# Patient Record
Sex: Male | Born: 1970
Health system: Southern US, Community
[De-identification: ages and names within clinical notes are randomized; demographics above are authoritative.]

## PROBLEM LIST (undated history)

## (undated) DIAGNOSIS — F79 Unspecified intellectual disabilities: Secondary | ICD-10-CM

## (undated) DIAGNOSIS — R569 Unspecified convulsions: Secondary | ICD-10-CM

## (undated) DIAGNOSIS — N400 Enlarged prostate without lower urinary tract symptoms: Secondary | ICD-10-CM

## (undated) DIAGNOSIS — J449 Chronic obstructive pulmonary disease, unspecified: Secondary | ICD-10-CM

## (undated) DIAGNOSIS — F1020 Alcohol dependence, uncomplicated: Secondary | ICD-10-CM

## (undated) HISTORY — PX: TRANSURETHRAL RESECTION OF PROSTATE: SHX73

---

## 1998-03-06 ENCOUNTER — Emergency Department (HOSPITAL_COMMUNITY): Admission: EM | Admit: 1998-03-06 | Discharge: 1998-03-06 | Payer: Self-pay | Admitting: Emergency Medicine

## 2001-02-14 ENCOUNTER — Emergency Department (HOSPITAL_COMMUNITY): Admission: EM | Admit: 2001-02-14 | Discharge: 2001-02-15 | Payer: Self-pay | Admitting: Internal Medicine

## 2001-04-05 ENCOUNTER — Ambulatory Visit (HOSPITAL_COMMUNITY): Admission: RE | Admit: 2001-04-05 | Discharge: 2001-04-05 | Payer: Self-pay | Admitting: Unknown Physician Specialty

## 2001-04-09 ENCOUNTER — Encounter: Payer: Self-pay | Admitting: *Deleted

## 2001-04-09 ENCOUNTER — Emergency Department (HOSPITAL_COMMUNITY): Admission: EM | Admit: 2001-04-09 | Discharge: 2001-04-09 | Payer: Self-pay | Admitting: *Deleted

## 2001-04-13 ENCOUNTER — Emergency Department (HOSPITAL_COMMUNITY): Admission: EM | Admit: 2001-04-13 | Discharge: 2001-04-13 | Payer: Self-pay | Admitting: Internal Medicine

## 2001-04-21 ENCOUNTER — Encounter: Payer: Self-pay | Admitting: Emergency Medicine

## 2001-04-21 ENCOUNTER — Emergency Department (HOSPITAL_COMMUNITY): Admission: EM | Admit: 2001-04-21 | Discharge: 2001-04-21 | Payer: Self-pay | Admitting: Emergency Medicine

## 2001-08-09 ENCOUNTER — Emergency Department (HOSPITAL_COMMUNITY): Admission: EM | Admit: 2001-08-09 | Discharge: 2001-08-09 | Payer: Self-pay | Admitting: *Deleted

## 2001-09-26 ENCOUNTER — Emergency Department (HOSPITAL_COMMUNITY): Admission: EM | Admit: 2001-09-26 | Discharge: 2001-09-26 | Payer: Self-pay | Admitting: *Deleted

## 2001-10-03 ENCOUNTER — Emergency Department (HOSPITAL_COMMUNITY): Admission: EM | Admit: 2001-10-03 | Discharge: 2001-10-04 | Payer: Self-pay | Admitting: *Deleted

## 2001-10-31 ENCOUNTER — Encounter: Payer: Self-pay | Admitting: Emergency Medicine

## 2001-10-31 ENCOUNTER — Emergency Department (HOSPITAL_COMMUNITY): Admission: EM | Admit: 2001-10-31 | Discharge: 2001-10-31 | Payer: Self-pay | Admitting: Emergency Medicine

## 2001-11-11 ENCOUNTER — Emergency Department (HOSPITAL_COMMUNITY): Admission: EM | Admit: 2001-11-11 | Discharge: 2001-11-11 | Payer: Self-pay | Admitting: *Deleted

## 2001-11-30 ENCOUNTER — Emergency Department (HOSPITAL_COMMUNITY): Admission: EM | Admit: 2001-11-30 | Discharge: 2001-12-01 | Payer: Self-pay

## 2002-04-21 ENCOUNTER — Emergency Department (HOSPITAL_COMMUNITY): Admission: EM | Admit: 2002-04-21 | Discharge: 2002-04-21 | Payer: Self-pay | Admitting: Emergency Medicine

## 2002-05-23 ENCOUNTER — Encounter: Payer: Self-pay | Admitting: Emergency Medicine

## 2002-05-23 ENCOUNTER — Emergency Department (HOSPITAL_COMMUNITY): Admission: EM | Admit: 2002-05-23 | Discharge: 2002-05-23 | Payer: Self-pay | Admitting: Emergency Medicine

## 2003-01-12 ENCOUNTER — Emergency Department (HOSPITAL_COMMUNITY): Admission: EM | Admit: 2003-01-12 | Discharge: 2003-01-12 | Payer: Self-pay | Admitting: Emergency Medicine

## 2003-02-10 ENCOUNTER — Emergency Department (HOSPITAL_COMMUNITY): Admission: EM | Admit: 2003-02-10 | Discharge: 2003-02-11 | Payer: Self-pay | Admitting: Emergency Medicine

## 2003-02-11 ENCOUNTER — Encounter: Payer: Self-pay | Admitting: Emergency Medicine

## 2003-02-23 ENCOUNTER — Emergency Department (HOSPITAL_COMMUNITY): Admission: EM | Admit: 2003-02-23 | Discharge: 2003-02-23 | Payer: Self-pay | Admitting: *Deleted

## 2003-02-23 ENCOUNTER — Encounter: Payer: Self-pay | Admitting: *Deleted

## 2003-03-13 ENCOUNTER — Emergency Department (HOSPITAL_COMMUNITY): Admission: EM | Admit: 2003-03-13 | Discharge: 2003-03-13 | Payer: Self-pay | Admitting: Emergency Medicine

## 2003-03-18 ENCOUNTER — Emergency Department (HOSPITAL_COMMUNITY): Admission: EM | Admit: 2003-03-18 | Discharge: 2003-03-18 | Payer: Self-pay | Admitting: *Deleted

## 2003-03-18 ENCOUNTER — Encounter: Payer: Self-pay | Admitting: *Deleted

## 2003-03-26 ENCOUNTER — Emergency Department (HOSPITAL_COMMUNITY): Admission: EM | Admit: 2003-03-26 | Discharge: 2003-03-26 | Payer: Self-pay | Admitting: Emergency Medicine

## 2003-05-07 ENCOUNTER — Emergency Department (HOSPITAL_COMMUNITY): Admission: EM | Admit: 2003-05-07 | Discharge: 2003-05-08 | Payer: Self-pay | Admitting: Emergency Medicine

## 2003-06-05 ENCOUNTER — Emergency Department (HOSPITAL_COMMUNITY): Admission: EM | Admit: 2003-06-05 | Discharge: 2003-06-06 | Payer: Self-pay | Admitting: *Deleted

## 2003-08-26 ENCOUNTER — Emergency Department (HOSPITAL_COMMUNITY): Admission: EM | Admit: 2003-08-26 | Discharge: 2003-08-26 | Payer: Self-pay | Admitting: Emergency Medicine

## 2003-08-29 ENCOUNTER — Emergency Department (HOSPITAL_COMMUNITY): Admission: EM | Admit: 2003-08-29 | Discharge: 2003-08-30 | Payer: Self-pay | Admitting: Emergency Medicine

## 2003-09-25 ENCOUNTER — Emergency Department (HOSPITAL_COMMUNITY): Admission: EM | Admit: 2003-09-25 | Discharge: 2003-09-25 | Payer: Self-pay | Admitting: Internal Medicine

## 2003-11-18 ENCOUNTER — Ambulatory Visit (HOSPITAL_COMMUNITY): Admission: RE | Admit: 2003-11-18 | Discharge: 2003-11-18 | Payer: Self-pay | Admitting: Family Medicine

## 2004-03-24 ENCOUNTER — Emergency Department (HOSPITAL_COMMUNITY): Admission: EM | Admit: 2004-03-24 | Discharge: 2004-03-24 | Payer: Self-pay | Admitting: *Deleted

## 2004-04-03 ENCOUNTER — Emergency Department (HOSPITAL_COMMUNITY): Admission: EM | Admit: 2004-04-03 | Discharge: 2004-04-03 | Payer: Self-pay | Admitting: Emergency Medicine

## 2004-04-04 ENCOUNTER — Emergency Department (HOSPITAL_COMMUNITY): Admission: EM | Admit: 2004-04-04 | Discharge: 2004-04-04 | Payer: Self-pay | Admitting: Emergency Medicine

## 2004-05-07 ENCOUNTER — Emergency Department (HOSPITAL_COMMUNITY): Admission: EM | Admit: 2004-05-07 | Discharge: 2004-05-08 | Payer: Self-pay | Admitting: Emergency Medicine

## 2004-09-11 ENCOUNTER — Emergency Department (HOSPITAL_COMMUNITY): Admission: EM | Admit: 2004-09-11 | Discharge: 2004-09-11 | Payer: Self-pay | Admitting: Emergency Medicine

## 2004-10-09 ENCOUNTER — Emergency Department (HOSPITAL_COMMUNITY): Admission: EM | Admit: 2004-10-09 | Discharge: 2004-10-10 | Payer: Self-pay | Admitting: *Deleted

## 2004-10-20 ENCOUNTER — Emergency Department (HOSPITAL_COMMUNITY): Admission: EM | Admit: 2004-10-20 | Discharge: 2004-10-20 | Payer: Self-pay | Admitting: *Deleted

## 2004-11-12 ENCOUNTER — Emergency Department (HOSPITAL_COMMUNITY): Admission: EM | Admit: 2004-11-12 | Discharge: 2004-11-12 | Payer: Self-pay | Admitting: Emergency Medicine

## 2004-11-18 ENCOUNTER — Emergency Department (HOSPITAL_COMMUNITY): Admission: EM | Admit: 2004-11-18 | Discharge: 2004-11-18 | Payer: Self-pay | Admitting: Emergency Medicine

## 2004-11-21 ENCOUNTER — Encounter (HOSPITAL_COMMUNITY): Admission: RE | Admit: 2004-11-21 | Discharge: 2004-12-21 | Payer: Self-pay | Admitting: Emergency Medicine

## 2005-01-11 ENCOUNTER — Emergency Department (HOSPITAL_COMMUNITY): Admission: EM | Admit: 2005-01-11 | Discharge: 2005-01-11 | Payer: Self-pay | Admitting: Emergency Medicine

## 2005-02-22 ENCOUNTER — Emergency Department (HOSPITAL_COMMUNITY): Admission: EM | Admit: 2005-02-22 | Discharge: 2005-02-22 | Payer: Self-pay | Admitting: Emergency Medicine

## 2005-02-26 ENCOUNTER — Emergency Department (HOSPITAL_COMMUNITY): Admission: EM | Admit: 2005-02-26 | Discharge: 2005-02-26 | Payer: Self-pay | Admitting: Emergency Medicine

## 2005-03-15 ENCOUNTER — Emergency Department (HOSPITAL_COMMUNITY): Admission: EM | Admit: 2005-03-15 | Discharge: 2005-03-15 | Payer: Self-pay | Admitting: *Deleted

## 2005-04-10 ENCOUNTER — Emergency Department (HOSPITAL_COMMUNITY): Admission: EM | Admit: 2005-04-10 | Discharge: 2005-04-10 | Payer: Self-pay | Admitting: Emergency Medicine

## 2005-06-06 ENCOUNTER — Emergency Department (HOSPITAL_COMMUNITY): Admission: EM | Admit: 2005-06-06 | Discharge: 2005-06-06 | Payer: Self-pay | Admitting: Emergency Medicine

## 2005-06-17 ENCOUNTER — Emergency Department (HOSPITAL_COMMUNITY): Admission: EM | Admit: 2005-06-17 | Discharge: 2005-06-18 | Payer: Self-pay | Admitting: Emergency Medicine

## 2005-06-28 ENCOUNTER — Emergency Department (HOSPITAL_COMMUNITY): Admission: EM | Admit: 2005-06-28 | Discharge: 2005-06-29 | Payer: Self-pay | Admitting: Emergency Medicine

## 2005-07-05 ENCOUNTER — Emergency Department (HOSPITAL_COMMUNITY): Admission: EM | Admit: 2005-07-05 | Discharge: 2005-07-05 | Payer: Self-pay | Admitting: Emergency Medicine

## 2005-10-13 ENCOUNTER — Emergency Department (HOSPITAL_COMMUNITY): Admission: EM | Admit: 2005-10-13 | Discharge: 2005-10-13 | Payer: Self-pay | Admitting: Emergency Medicine

## 2005-10-31 ENCOUNTER — Emergency Department (HOSPITAL_COMMUNITY): Admission: EM | Admit: 2005-10-31 | Discharge: 2005-10-31 | Payer: Self-pay | Admitting: Emergency Medicine

## 2005-11-06 ENCOUNTER — Emergency Department (HOSPITAL_COMMUNITY): Admission: EM | Admit: 2005-11-06 | Discharge: 2005-11-06 | Payer: Self-pay | Admitting: Emergency Medicine

## 2005-11-21 ENCOUNTER — Emergency Department (HOSPITAL_COMMUNITY): Admission: EM | Admit: 2005-11-21 | Discharge: 2005-11-21 | Payer: Self-pay | Admitting: Emergency Medicine

## 2005-12-02 ENCOUNTER — Emergency Department (HOSPITAL_COMMUNITY): Admission: EM | Admit: 2005-12-02 | Discharge: 2005-12-02 | Payer: Self-pay | Admitting: Emergency Medicine

## 2005-12-09 ENCOUNTER — Emergency Department (HOSPITAL_COMMUNITY): Admission: EM | Admit: 2005-12-09 | Discharge: 2005-12-09 | Payer: Self-pay | Admitting: Emergency Medicine

## 2005-12-16 ENCOUNTER — Emergency Department (HOSPITAL_COMMUNITY): Admission: EM | Admit: 2005-12-16 | Discharge: 2005-12-16 | Payer: Self-pay | Admitting: Emergency Medicine

## 2005-12-17 ENCOUNTER — Emergency Department (HOSPITAL_COMMUNITY): Admission: EM | Admit: 2005-12-17 | Discharge: 2005-12-17 | Payer: Self-pay | Admitting: Emergency Medicine

## 2006-01-01 ENCOUNTER — Emergency Department (HOSPITAL_COMMUNITY): Admission: EM | Admit: 2006-01-01 | Discharge: 2006-01-01 | Payer: Self-pay | Admitting: Emergency Medicine

## 2006-01-03 ENCOUNTER — Emergency Department (HOSPITAL_COMMUNITY): Admission: EM | Admit: 2006-01-03 | Discharge: 2006-01-04 | Payer: Self-pay | Admitting: Emergency Medicine

## 2006-01-10 ENCOUNTER — Ambulatory Visit: Payer: Self-pay | Admitting: Orthopedic Surgery

## 2006-02-12 ENCOUNTER — Ambulatory Visit: Payer: Self-pay | Admitting: Orthopedic Surgery

## 2006-02-28 ENCOUNTER — Emergency Department (HOSPITAL_COMMUNITY): Admission: EM | Admit: 2006-02-28 | Discharge: 2006-02-28 | Payer: Self-pay | Admitting: Emergency Medicine

## 2006-03-06 ENCOUNTER — Emergency Department (HOSPITAL_COMMUNITY): Admission: EM | Admit: 2006-03-06 | Discharge: 2006-03-07 | Payer: Self-pay | Admitting: Emergency Medicine

## 2006-03-22 ENCOUNTER — Emergency Department (HOSPITAL_COMMUNITY): Admission: EM | Admit: 2006-03-22 | Discharge: 2006-03-22 | Payer: Self-pay | Admitting: Emergency Medicine

## 2006-03-30 ENCOUNTER — Emergency Department (HOSPITAL_COMMUNITY): Admission: EM | Admit: 2006-03-30 | Discharge: 2006-03-30 | Payer: Self-pay | Admitting: Emergency Medicine

## 2006-04-24 ENCOUNTER — Emergency Department (HOSPITAL_COMMUNITY): Admission: EM | Admit: 2006-04-24 | Discharge: 2006-04-24 | Payer: Self-pay | Admitting: Emergency Medicine

## 2006-05-08 ENCOUNTER — Emergency Department (HOSPITAL_COMMUNITY): Admission: EM | Admit: 2006-05-08 | Discharge: 2006-05-08 | Payer: Self-pay | Admitting: Emergency Medicine

## 2006-05-15 ENCOUNTER — Emergency Department (HOSPITAL_COMMUNITY): Admission: EM | Admit: 2006-05-15 | Discharge: 2006-05-15 | Payer: Self-pay | Admitting: Emergency Medicine

## 2006-05-25 ENCOUNTER — Emergency Department (HOSPITAL_COMMUNITY): Admission: EM | Admit: 2006-05-25 | Discharge: 2006-05-25 | Payer: Self-pay | Admitting: Emergency Medicine

## 2006-05-29 ENCOUNTER — Emergency Department (HOSPITAL_COMMUNITY): Admission: EM | Admit: 2006-05-29 | Discharge: 2006-05-29 | Payer: Self-pay | Admitting: Emergency Medicine

## 2006-10-05 ENCOUNTER — Emergency Department (HOSPITAL_COMMUNITY): Admission: EM | Admit: 2006-10-05 | Discharge: 2006-10-05 | Payer: Self-pay | Admitting: Emergency Medicine

## 2006-10-15 ENCOUNTER — Emergency Department (HOSPITAL_COMMUNITY): Admission: EM | Admit: 2006-10-15 | Discharge: 2006-10-15 | Payer: Self-pay | Admitting: Emergency Medicine

## 2006-10-15 ENCOUNTER — Ambulatory Visit: Payer: Self-pay | Admitting: Orthopedic Surgery

## 2006-10-17 ENCOUNTER — Emergency Department (HOSPITAL_COMMUNITY): Admission: EM | Admit: 2006-10-17 | Discharge: 2006-10-17 | Payer: Self-pay | Admitting: Emergency Medicine

## 2006-10-23 ENCOUNTER — Inpatient Hospital Stay (HOSPITAL_COMMUNITY): Admission: EM | Admit: 2006-10-23 | Discharge: 2006-10-25 | Payer: Self-pay | Admitting: Psychiatry

## 2006-10-23 ENCOUNTER — Ambulatory Visit: Payer: Self-pay | Admitting: Psychiatry

## 2008-04-13 ENCOUNTER — Ambulatory Visit (HOSPITAL_COMMUNITY): Admission: RE | Admit: 2008-04-13 | Discharge: 2008-04-13 | Payer: Self-pay | Admitting: Family Medicine

## 2008-07-10 ENCOUNTER — Ambulatory Visit (HOSPITAL_COMMUNITY): Admission: RE | Admit: 2008-07-10 | Discharge: 2008-07-10 | Payer: Self-pay | Admitting: Family Medicine

## 2011-02-10 NOTE — H&P (Signed)
[name], [name]               ACCOUNT NO.:  [id number]   MEDICAL RECORD NO.:  [id number]          PATIENT TYPE:  IPS   LOCATION:  0504                          FACILITY:  [facility]   PHYSICIAN:  Geoffery Lyons, M.D.      DATE OF BIRTH:  12-01-1970   DATE OF ADMISSION:  10/23/2006  DATE OF DISCHARGE:                       PSYCHIATRIC ADMISSION ASSESSMENT   IDENTIFYING INFORMATION:  This is a 40 year old, white male who is  single.  This is a voluntary admission.   HISTORY OF PRESENT ILLNESS:  The patient's father brought him to the  Emergency Room where he complained of drinking about 8 beers a day.  He  says that he is tired of it and is requesting detox from alcohol.  He  admits to a history of polysubstance abuse and has smoked marijuana in  the past and cannot remember the last time he smoked and also has used  cocaine in the past, last time a couple of months ago.  He says that he  has previously been admitted to [facility] where he was  treated in the [facility] but cannot remember how long he was able to  remain abstinent from drugs and alcohol after the discharge.  He is  significantly intellectually limited.  Lives at home with his father and  says he accesses alcohol and drugs by way of his dad who buys it for  him.  He denies any suicidal thought.  Reports he had been seen at  mental health in the past but not recently.  Also went AA meetings once  in the past and that this helped him.  The patient is an unreliable  historian.   PAST PSYCHIATRIC HISTORY:  The patient has no current psychiatric care.  This is his first admission to [facility].  He  does have a history of prior admissions to [facility] most  recently in September 2007.  At that time, he was brought to the  Emergency Room in the custody of police, and the record reflects that he  had been using escalating amounts of alcohol with increasingly violent  behavior.   At that time, he was also positive for  tetrahydrocannabinoids.  Other hospitalizations are not known.   SOCIAL HISTORY:  The patient reports he completed high school.  Currently living at home with his father and another gentleman who he  reports is a family friend who is 28 years of age.  The patient has 2  sisters and 2 brothers who also live in the area.  He reports he is the  only drinker or substance abuser in the family.  He reports that his  mother died in 66.  He is on disability and receives a disability  check that goes to his father who is his payee.  No current legal  problems.  The patient reports he chops wood during the day.  No other  significant relationships.  No girlfriend.  Social support system is  unclear.   ALCOHOL AND DRUG HISTORY:  In addition to the alcohol and drug history  noted  above, the patient reports he smokes about 2 packs per day of  tobacco.  No known history of IV drug abuse.   MEDICAL HISTORY:  The patient is followed by Dr. Butch Penny, MD.  The  patient cannot report when he last saw Dr. Renard Matter, his family  practitioner.  The record reflects 3 prior Emergency Room visits this  month and generally frequent Emergency Room visits for various problems.  He denies any medical problems today.   MEDICATIONS:  None.   DRUG ALLERGIES:  NONE.   Positive physical findings.  The patient's full physical exam that was  done in the Emergency Room is generally unremarkable on admission to the  unit.  Very small built, white male, appears poorly nourished,  disheveled, poor hygiene, 5 feet 7 inches tall, 144 pounds, temperature  98.1, pulse 98, respirations 18, blood pressure 122/80.   DIAGNOSTIC STUDIES:  CBC:  WBC 10, hemoglobin 15.8, hematocrit 46.3,  platelets 227,000.  Chemistries:  Sodium 141, potassium 4.2, chloride  104, carbon dioxide 30, BUN 7, creatinine 0.68, glucose 108.  Liver  enzymes:  SGOT 29, SGPT 20, alkaline phosphatase 101, and  total  bilirubin 0.5, all normal.  Calcium within normal limits at 9.3.  His  urine drug screen was negative for all substances.  Urinalysis is  pending as is his TSH.  Alcohol level in the Emergency Room was 267.   MENTAL STATUS EXAM:  Fully alert male, pleasant, disheveled with poor  hygiene.  Eye contact is good.  He is appropriate, polite.  Speech is  quite slurred and dysarthric which is probably his baseline.  Production  is normal.  Pace and tone is normal.  He is eager to meet with [place] and  express himself.  Mood is depressed.  He does get tearful when we ask  about his mother and says that life was better before when she was  alive, and she died in 67.  He feels that AA helped him and that is  what he would like to go back to.  His thinking is quite concrete.  Verbiage is simple and straight forward.  Thought process is logical.  No evidence of psychosis.  He denies any suicidal or homicidal thoughts.  We are unable to get a very clear picture of how he spends his time  every day although he says that he does cook at home, gets groceries,  also chops wood, watches television.  Cognition is intact but  intellectually limited.  Insight poor.  Concentration is adequate.  Judgment poor.  Impulse control poor.   AXIS I:  ETOH abuse and dependence, polysubstance abuse, rule out  depressive disorder NOS.  AXIS II:  Deferred.  AXIS III:  No diagnosis.  AXIS IV:  Severe issues at home.  Rule out severe issues at home with  primary support group.  AXIS V:  Current 30, the past year 76.   PLAN:  Voluntarily admit the patient with q. 15 minute checks in place  with a goal of a safe detox in 5 days.  We started him on a Librium  protocol which he is tolerating well.  We will see if we can evaluate  his home support system and are going to attempt to investigate the issue of his father buying the alcohol and drugs for him.  We will  evaluate the need for protective service referral.   Meanwhile, we will  plan on referring him to AA and back to [facility]  for counseling and followup, and the patient is in agreement with the  plan.  Estimated length of stay is 5 days.      Margaret A. Scott, N.P.      Geoffery Lyons, M.D.  Electronically Signed    MAS/MEDQ  D:  10/23/2006  T:  10/23/2006  Job:  [id number]

## 2011-07-17 ENCOUNTER — Other Ambulatory Visit (HOSPITAL_COMMUNITY): Payer: Self-pay | Admitting: Urology

## 2011-07-18 ENCOUNTER — Ambulatory Visit (HOSPITAL_COMMUNITY)
Admission: RE | Admit: 2011-07-18 | Discharge: 2011-07-18 | Disposition: A | Discharge status: Home / Self Care | Payer: Medicare Other | Source: Ambulatory Visit | Attending: Urology | Admitting: Urology

## 2011-07-18 ENCOUNTER — Encounter (HOSPITAL_COMMUNITY): Payer: Self-pay

## 2011-07-18 DIAGNOSIS — R310 Gross hematuria: Secondary | ICD-10-CM | POA: Insufficient documentation

## 2011-07-18 DIAGNOSIS — R9389 Abnormal findings on diagnostic imaging of other specified body structures: Secondary | ICD-10-CM | POA: Insufficient documentation

## 2011-07-18 MED ORDER — IOHEXOL 300 MG/ML  SOLN
150.0000 mL | Freq: Once | INTRAMUSCULAR | Status: AC | PRN
  Administered 2011-07-18: 150 mL via INTRAVENOUS

## 2011-08-01 ENCOUNTER — Other Ambulatory Visit (HOSPITAL_COMMUNITY): Payer: Medicare Other

## 2011-08-07 ENCOUNTER — Encounter (HOSPITAL_COMMUNITY): Payer: Self-pay | Admitting: Pharmacy Technician

## 2011-08-09 ENCOUNTER — Other Ambulatory Visit (HOSPITAL_COMMUNITY): Payer: Medicare Other

## 2011-08-11 ENCOUNTER — Encounter (HOSPITAL_COMMUNITY)
Admission: RE | Admit: 2011-08-11 | Discharge: 2011-08-11 | Disposition: A | Discharge status: Home / Self Care | Payer: Medicare Other | Source: Ambulatory Visit | Attending: Urology | Admitting: Urology

## 2011-08-11 ENCOUNTER — Encounter (HOSPITAL_COMMUNITY): Payer: Self-pay

## 2011-08-11 HISTORY — DX: Unspecified convulsions: R56.9

## 2011-08-11 LAB — CBC
MCH: 30.5 pg (ref 26.0–34.0)
MCV: 94.6 fL (ref 78.0–100.0)
Platelets: 161 10*3/uL (ref 150–400)
RDW: 13.1 % (ref 11.5–15.5)
WBC: 6.9 10*3/uL (ref 4.0–10.5)

## 2011-08-11 LAB — BASIC METABOLIC PANEL
Calcium: 9.9 mg/dL (ref 8.4–10.5)
Chloride: 102 mEq/L (ref 96–112)
Creatinine, Ser: 0.75 mg/dL (ref 0.50–1.35)
GFR calc Af Amer: >90 mL/min (ref >90)

## 2011-08-11 LAB — SURGICAL PCR SCREEN: MRSA, PCR: NEGATIVE

## 2011-08-11 NOTE — Patient Instructions (Addendum)
20 [name]  08/11/2011   Your procedure is scheduled on:  08/15/2011  Report to [facility] at 0730 AM.  Call this number if you have problems the morning of surgery: [phone]   Remember:   Do not eat food:After Midnight.  Do not drink clear liquids: After Midnight.  Take these medicines the morning of surgery with A SIP OF WATER: Albuterol inhaler   Do not wear jewelry, make-up or nail polish.  Do not wear lotions, powders, or perfumes. You may wear deodorant.  Do not shave 48 hours prior to surgery.  Do not bring valuables to the hospital.  Contacts, dentures or bridgework may not be worn into surgery.  Leave suitcase in the car. After surgery it may be brought to your room.  For patients admitted to the hospital, checkout time is 11:00 AM the day of discharge.   Patients discharged the day of surgery will not be allowed to drive home.  Name and phone number of your driver:   Special Instructions: CHG Shower Shower 2 days before surgery and 1 day before surgery with Hibiclens.   Please read over the following fact sheets that you were given: Pain Booklet, MRSA Information, Surgical Site Infection Prevention, Anesthesia Post-op Instructions and Care and Recovery After Surgery

## 2011-08-14 NOTE — H&P (Signed)
NAMEDEVEAN, [name]               ACCOUNT NO.:  [id number]  MEDICAL RECORD NO.:  [id number]  LOCATION:                                 FACILITY:  PHYSICIAN:  Ky Barban, M.D.DATE OF BIRTH:  [birth year]  DATE OF ADMISSION:  08/14/2011 DATE OF DISCHARGE:  LH                             HISTORY & PHYSICAL   CHIEF COMPLAINT:  Gross total painless hematuria.  HISTORY:  A 40 year old gentleman was referred to me by Dr. Renard Matter. The patient had an episode of gross total painless hematuria and more significant voiding difficulty.  He had a cystoscopy done in the office. It shows there is a questionable lesion in the bladder which does not look like bladder tumor, so I was not sure.  So I decided to go ahead and do a cysto, possible biopsy, and also cystogram under anesthesia in the hospital.  He is coming as outpatient.  We will go ahead and do the procedure in the hospital.  His other workup included, urine culture is negative.  Urine cytologies are negative.  CT abdomen and pelvis with and without contrast is negative.  SOCIAL HISTORY:  He smokes 1 pack a day.  Does not drink.  FAMILY HISTORY:  Negative.  REVIEW OF SYSTEMS:  Unremarkable.  PHYSICAL EXAMINATION:  VITAL SIGNS:  Blood pressure 110/80 and temperature is normal.  CENTRAL NERVOUS SYSTEM:  No gross neurological deficit. HEAD, NECK, EYE, ENT:  Negative. CHEST:  Symmetrical. HEART:  Regular sinus rhythm.  No murmur. ABDOMEN:  Soft, flat.  Liver, spleen, kidneys are not palpable.  No CVA tenderness. EXTERNAL GENITALIA:  Unremarkable. RECTAL:  Normal sphincter tone.  Prostate 1.5+, smooth and firm.  IMPRESSION:  Gross hematuria.  Questionable bladder lesion.  PLAN:  Cystoscopy, possible bladder biopsy, and cystogram under anesthesia as outpatient.  And it should be mentioned that she was having some dysuria and Dr. Renard Matter has put him on Cipro.  His urine culture is negative.  I appreciate Dr. Renard Matter, letting  me see this patient.     Ky Barban, M.D.     MIJ/MEDQ  D:  08/14/2011  T:  08/14/2011  Job:  [id number]  cc:   Angus G. Renard Matter, MD Fax: [phone]

## 2011-08-15 ENCOUNTER — Ambulatory Visit (HOSPITAL_COMMUNITY): Payer: Medicare Other

## 2011-08-15 ENCOUNTER — Ambulatory Visit (HOSPITAL_COMMUNITY)
Admission: RE | Admit: 2011-08-15 | Discharge: 2011-08-15 | Disposition: A | Discharge status: Home / Self Care | Payer: Medicare Other | Source: Ambulatory Visit | Attending: Urology | Admitting: Urology

## 2011-08-15 ENCOUNTER — Encounter (HOSPITAL_COMMUNITY)
Admission: RE | Disposition: A | Discharge status: Home / Self Care | Payer: Self-pay | Source: Ambulatory Visit | Attending: Urology

## 2011-08-15 ENCOUNTER — Ambulatory Visit (HOSPITAL_COMMUNITY): Payer: Medicare Other | Admitting: Anesthesiology

## 2011-08-15 ENCOUNTER — Encounter (HOSPITAL_COMMUNITY): Payer: Self-pay | Admitting: Anesthesiology

## 2011-08-15 ENCOUNTER — Other Ambulatory Visit: Payer: Self-pay | Admitting: Urology

## 2011-08-15 ENCOUNTER — Encounter (HOSPITAL_COMMUNITY): Payer: Self-pay | Admitting: *Deleted

## 2011-08-15 DIAGNOSIS — N323 Diverticulum of bladder: Secondary | ICD-10-CM | POA: Insufficient documentation

## 2011-08-15 DIAGNOSIS — Z01812 Encounter for preprocedural laboratory examination: Secondary | ICD-10-CM | POA: Insufficient documentation

## 2011-08-15 DIAGNOSIS — N35919 Unspecified urethral stricture, male, unspecified site: Secondary | ICD-10-CM | POA: Insufficient documentation

## 2011-08-15 HISTORY — PX: CYSTOSCOPY WITH BIOPSY: SHX5122

## 2011-08-15 SURGERY — CYSTOSCOPY, WITH BIOPSY
Anesthesia: General | Site: Bladder | Wound class: Clean Contaminated

## 2011-08-15 MED ORDER — PROPOFOL 10 MG/ML IV EMUL
INTRAVENOUS | Status: AC
  Filled 2011-08-15: qty 20

## 2011-08-15 MED ORDER — MIDAZOLAM HCL 2 MG/2ML IJ SOLN
1.0000 mg | INTRAMUSCULAR | Status: DC | PRN
  Administered 2011-08-15: 2 mg via INTRAVENOUS

## 2011-08-15 MED ORDER — LIDOCAINE HCL 1 % IJ SOLN
INTRAMUSCULAR | Status: DC | PRN
  Administered 2011-08-15: 10 mg via INTRADERMAL

## 2011-08-15 MED ORDER — STERILE WATER FOR IRRIGATION IR SOLN
Status: DC | PRN
  Administered 2011-08-15: 10 mL

## 2011-08-15 MED ORDER — ROCURONIUM BROMIDE 50 MG/5ML IV SOLN
INTRAVENOUS | Status: AC
  Filled 2011-08-15: qty 1

## 2011-08-15 MED ORDER — SODIUM CHLORIDE 0.9 % IN NEBU
INHALATION_SOLUTION | RESPIRATORY_TRACT | Status: AC
  Filled 2011-08-15: qty 3

## 2011-08-15 MED ORDER — PROPOFOL 10 MG/ML IV EMUL
INTRAVENOUS | Status: DC | PRN
  Administered 2011-08-15: 20 mg via INTRAVENOUS
  Administered 2011-08-15: 80 mg via INTRAVENOUS
  Administered 2011-08-15 (×2): 50 mg via INTRAVENOUS
  Administered 2011-08-15: 150 mg via INTRAVENOUS

## 2011-08-15 MED ORDER — IOHEXOL 350 MG/ML SOLN
INTRAVENOUS | Status: DC | PRN
  Administered 2011-08-15: 50 mL

## 2011-08-15 MED ORDER — GLYCOPYRROLATE 0.2 MG/ML IJ SOLN
INTRAMUSCULAR | Status: DC | PRN
  Administered 2011-08-15: .4 mg via INTRAVENOUS

## 2011-08-15 MED ORDER — NEOSTIGMINE METHYLSULFATE 1 MG/ML IJ SOLN
INTRAMUSCULAR | Status: DC | PRN
  Administered 2011-08-15: 2 mg via INTRAVENOUS

## 2011-08-15 MED ORDER — ONDANSETRON HCL 4 MG/2ML IJ SOLN: 4.0000 mg | Freq: Once | INTRAMUSCULAR | Status: DC | PRN

## 2011-08-15 MED ORDER — NEOSTIGMINE METHYLSULFATE 1 MG/ML IJ SOLN
INTRAMUSCULAR | Status: AC
  Filled 2011-08-15: qty 10

## 2011-08-15 MED ORDER — ALBUTEROL SULFATE (2.5 MG/3ML) 0.083% IN NEBU
2.5000 mg | INHALATION_SOLUTION | Freq: Once | RESPIRATORY_TRACT | Status: AC
  Administered 2011-08-15: 2.5 mg via RESPIRATORY_TRACT

## 2011-08-15 MED ORDER — GLYCOPYRROLATE 0.2 MG/ML IJ SOLN
INTRAMUSCULAR | Status: AC
  Filled 2011-08-15: qty 1

## 2011-08-15 MED ORDER — FENTANYL CITRATE 0.05 MG/ML IJ SOLN
INTRAMUSCULAR | Status: DC | PRN
  Administered 2011-08-15: 50 ug via INTRAVENOUS
  Administered 2011-08-15 (×2): 25 ug via INTRAVENOUS

## 2011-08-15 MED ORDER — FENTANYL CITRATE 0.05 MG/ML IJ SOLN: 25.0000 ug | INTRAMUSCULAR | Status: DC | PRN

## 2011-08-15 MED ORDER — SODIUM CHLORIDE 0.9 % IR SOLN
Status: DC | PRN
  Administered 2011-08-15: 3000 mL

## 2011-08-15 MED ORDER — MIDAZOLAM HCL 2 MG/2ML IJ SOLN
INTRAMUSCULAR | Status: AC
  Filled 2011-08-15: qty 2

## 2011-08-15 MED ORDER — FENTANYL CITRATE 0.05 MG/ML IJ SOLN
INTRAMUSCULAR | Status: AC
  Filled 2011-08-15: qty 2

## 2011-08-15 MED ORDER — ROCURONIUM BROMIDE 100 MG/10ML IV SOLN
INTRAVENOUS | Status: DC | PRN
  Administered 2011-08-15: 20 mg via INTRAVENOUS

## 2011-08-15 MED ORDER — LACTATED RINGERS IV SOLN
INTRAVENOUS | Status: DC
  Administered 2011-08-15: 08:00:00 via INTRAVENOUS

## 2011-08-15 MED ORDER — LIDOCAINE HCL (PF) 1 % IJ SOLN
INTRAMUSCULAR | Status: AC
  Filled 2011-08-15: qty 5

## 2011-08-15 MED ORDER — ALBUTEROL SULFATE (5 MG/ML) 0.5% IN NEBU
INHALATION_SOLUTION | RESPIRATORY_TRACT | Status: AC
  Filled 2011-08-15: qty 0.5

## 2011-08-15 MED ORDER — DIATRIZOATE MEGLUMINE 30 % UR SOLN
URETHRAL | Status: DC | PRN
  Administered 2011-08-15: 250 mL via URETHRAL

## 2011-08-15 SURGICAL SUPPLY — 29 items
BAG DRAIN URO TABLE W/ADPT NS (DRAPE) ×2 IMPLANT
BAG DRN 8 ADPR NS SKTRN CSTL (DRAPE) ×1
BAG HAMPER (MISCELLANEOUS) ×2 IMPLANT
BAG URINE DRAINAGE (UROLOGICAL SUPPLIES) ×1 IMPLANT
CATH 5 FR WEDGE TIP (UROLOGICAL SUPPLIES) ×1 IMPLANT
CATH FOLEY 2WAY SLVR  5CC 18FR (CATHETERS) ×1
CATH FOLEY 2WAY SLVR 5CC 18FR (CATHETERS) IMPLANT
CLOTH BEACON ORANGE TIMEOUT ST (SAFETY) ×2 IMPLANT
FORMALIN 10 PREFIL 120ML (MISCELLANEOUS) ×7 IMPLANT
GLOVE BIO SURGEON STRL SZ 6.5 (GLOVE) ×1 IMPLANT
GLOVE BIO SURGEON STRL SZ7 (GLOVE) ×2 IMPLANT
GLOVE BIOGEL PI IND STRL 7.0 (GLOVE) IMPLANT
GLOVE BIOGEL PI INDICATOR 7.0 (GLOVE) ×1
GLOVE EXAM NITRILE MD LF STRL (GLOVE) ×1 IMPLANT
GLYCINE 1.5% IRRIG UROMATIC (IV SOLUTION) ×2 IMPLANT
GOWN STRL REIN XL XLG (GOWN DISPOSABLE) ×2 IMPLANT
IV NS IRRIG 3000ML ARTHROMATIC (IV SOLUTION) ×4 IMPLANT
KIT ROOM TURNOVER AP CYSTO (KITS) ×2 IMPLANT
MANIFOLD NEPTUNE II (INSTRUMENTS) ×2 IMPLANT
NDL HYPO 18GX1.5 BLUNT FILL (NEEDLE) ×1 IMPLANT
NEEDLE HYPO 18GX1.5 BLUNT FILL (NEEDLE) ×2 IMPLANT
PACK CYSTO (CUSTOM PROCEDURE TRAY) ×2 IMPLANT
PAD ARMBOARD 7.5X6 YLW CONV (MISCELLANEOUS) ×2 IMPLANT
PAD TELFA 3X4 1S STER (GAUZE/BANDAGES/DRESSINGS) ×2 IMPLANT
SYR 50ML LL SCALE MARK (SYRINGE) ×1 IMPLANT
SYRINGE 10CC LL (SYRINGE) ×1 IMPLANT
SYRINGE IRR TOOMEY STRL 70CC (SYRINGE) ×1 IMPLANT
TOWEL OR 17X26 4PK STRL BLUE (TOWEL DISPOSABLE) ×2 IMPLANT
WATER STERILE IRR 1000ML POUR (IV SOLUTION) ×2 IMPLANT

## 2011-08-15 NOTE — Anesthesia Procedure Notes (Addendum)
Procedure Name: Intubation Date/Time: 08/15/2011 9:33 AM Performed by: Minerva Areola Pre-anesthesia Checklist: Patient identified, Patient being monitored, Timeout performed, Emergency Drugs available and Suction available Patient Re-evaluated:Patient Re-evaluated prior to inductionOxygen Delivery Method: Circle System Utilized Preoxygenation: Pre-oxygenation with 100% oxygen Intubation Type: IV induction Ventilation: Mask ventilation without difficulty Laryngoscope Size: Miller and 2 Grade View: Grade I Tube type: Oral Tube size: 7.0 mm Number of attempts: 1 Airway Equipment and Method: stylet Placement Confirmation: ETT inserted through vocal cords under direct vision,  positive ETCO2 and breath sounds checked- equal and bilateral Secured at: 20 cm Tube secured with: Tape Dental Injury: Teeth and Oropharynx as per pre-operative assessment  Comments:  Dr. Jayme Cloud in room. Attempted insertion of 4 LMA Unable to insert.  Attempted 3 LMA insertion. Unable to pass.Proceeded to GOT as above.

## 2011-08-15 NOTE — Op Note (Signed)
NAMECALLEN, [name]               ACCOUNT NO.:  [id number]  MEDICAL RECORD NO.:  [id number]  LOCATION:  APPO                          FACILITY:  [facility]  PHYSICIAN:  Ky Barban, M.D.DATE OF BIRTH:  06-Feb-1971  DATE OF PROCEDURE:  08/15/2011 DATE OF DISCHARGE:  08/15/2011                              OPERATIVE REPORT   PREOPERATIVE DIAGNOSIS:  Gross hematuria.  POSTOPERATIVE DIAGNOSIS:  Sub-meatal stricture bilateral very small Hutch diverticulum of the bladder.  ANESTHESIA:  General.  PROCEDURE:  Sub-meatal stricture dilation, cystoscopy, bladder biopsy, and cystogram.  ANESTHESIA:  General endotracheal.  PROCEDURE:  The patient under general endotracheal anesthesia in lithotomy position after usual prep and drape, I could not get my number 22 cystoscope in and he has a sub-meatal stricture which I dilated with the help of [employer] sounds.  Then cystoscope #25 was introduced into the bladder.  It is inspected, bladder grossly looks normal.  There is a small dimple just lateral to the right ureteral orifice and also left ureteral orifice.  There was a site of Hutch diverticulum and I put a wedge catheter on the right dimple, injected the dye but the dye leaks into the bladder.  Nothing goes into the.  There is no diverticulum at the just little folds.  I wanted to make sure there is no diverticulum, so I used the ureteroscope to look into this area.  There is no diverticulum just a little fold.  I was able to take a biopsy from the opening of this fold with the help of a flexible biopsy forceps and then fulgurated the area with the help of a Greenwald electrode.  Cystoscope was removed, #18 Foley catheter inserted into the bladder.  Then, a cystogram was done.  I used about 350 mL of Hypaque solution, both AP and oblique views of the bladder were taken but there is no diverticulum.  Bladder was emptied.  I also had to cauterize the meatus because he was bleeding from  the area where I had dilated the meatus. So it is rather than meatal stenosis, not some meatal stricture, so that what it is.  All the instruments were removed.  The patient left the operating room in satisfactory condition.     Ky Barban, M.D.     MIJ/MEDQ  D:  08/15/2011  T:  08/15/2011  Job:  [id number]

## 2011-08-15 NOTE — Anesthesia Preprocedure Evaluation (Addendum)
Anesthesia Evaluation  Patient identified by MRN, date of birth, ID band Patient awake    Reviewed: Allergy & Precautions, H&P , NPO status , Patient's Chart, lab work & pertinent test results  Airway Mallampati: I      Dental  (+) Edentulous Upper and Edentulous Lower   Pulmonary asthma ,    Pulmonary exam normal       Cardiovascular neg cardio ROS Regular Normal    Neuro/Psych Seizures - (none x 3 yrs), Well Controlled,     GI/Hepatic   Endo/Other    Renal/GU      Musculoskeletal   Abdominal   Peds  Hematology   Anesthesia Other Findings   Reproductive/Obstetrics                           Anesthesia Physical Anesthesia Plan  ASA: II  Anesthesia Plan: General   Post-op Pain Management:    Induction: Intravenous  Airway Management Planned: LMA  Additional Equipment:   Intra-op Plan:   Post-operative Plan: Extubation in OR  Informed Consent: I have reviewed the patients History and Physical, chart, labs and discussed the procedure including the risks, benefits and alternatives for the proposed anesthesia with the patient or authorized representative who has indicated his/her understanding and acceptance.     Plan Discussed with:   Anesthesia Plan Comments:         Anesthesia Quick Evaluation

## 2011-08-15 NOTE — Brief Op Note (Signed)
08/15/2011  10:21 AM  PATIENT:  [name]  40 y.o. male  PRE-OPERATIVE DIAGNOSIS:  hematuria  POST-OPERATIVE DIAGNOSIS:  hematuria, sub-meatal stricture  PROCEDURE:  Procedure(s): CYSTOSCOPY WITH BIOPSY  SURGEON:  Surgeon(s): Ky Barban  PHYSICIAN ASSISTANT:   ASSISTANTS: none   ANESTHESIA:   general  EBL:  Total I/O In: 900 [I.V.:900] Out: 0   BLOOD ADMINISTERED:none  DRAINS: none   LOCAL MEDICATIONS USED:  NONE  SPECIMEN:  Source of Specimen:  bladder rt wall  DISPOSITION OF SPECIMEN:  PATHOLOGY  COUNTS:  YES  TOURNIQUET:  * No tourniquets in log *  DICTATION: .Other Dictation: Dictation Number [id number]  PLAN OF CARE: Discharge to home after PACU  PATIENT DISPOSITION:  PACU - hemodynamically stable.   Delay start of Pharmacological VTE agent (>24hrs) due to surgical blood loss or risk of bleeding:  {YES/NO/NOT APPLICABLE:20182

## 2011-08-15 NOTE — Progress Notes (Signed)
Pt was examined no change in h&p.

## 2011-08-15 NOTE — Anesthesia Postprocedure Evaluation (Signed)
Anesthesia Post Note  Patient: [name]  Procedure(s) Performed:  CYSTOSCOPY WITH BIOPSY - Cystoscopy/Cystogram with Bladder Bx  Anesthesia type: General  Patient location: PACU  Post pain: Pain level controlled  Post assessment: Post-op Vital signs reviewed, Patient's Cardiovascular Status Stable, Respiratory Function Stable, Patent Airway, No signs of Nausea or vomiting and Pain level controlled  Last Vitals:  Filed Vitals:   08/15/11 0915  BP: 97/62  Pulse:   Temp:   Resp: 30    Post vital signs: Reviewed and stable Hr 79   Level of consciousness: awake and alert   Complications: No apparent anesthesia complications

## 2011-08-15 NOTE — Transfer of Care (Signed)
Immediate Anesthesia Transfer of Care Note  Patient: [name]  Procedure(s) Performed:  CYSTOSCOPY WITH BIOPSY - Cystoscopy/Cystogram with Bladder Bx  Patient Location: PACU  Anesthesia Type: General  Level of Consciousness: awake  Airway & Oxygen Therapy: Patient Spontanous Breathing and non-rebreather face mask  Post-op Assessment: Report given to PACU RN, Post -op Vital signs reviewed and stable and Patient moving all extremities  Post vital signs: Reviewed and stable  Complications: No apparent anesthesia complications

## 2011-08-15 NOTE — Progress Notes (Signed)
PT reexamined no change in pt's condition since the original h& p.

## 2011-08-22 ENCOUNTER — Encounter (HOSPITAL_COMMUNITY): Payer: Self-pay | Admitting: Urology

## 2011-11-01 ENCOUNTER — Other Ambulatory Visit (HOSPITAL_COMMUNITY): Payer: Self-pay | Admitting: Family Medicine

## 2011-11-01 ENCOUNTER — Ambulatory Visit (HOSPITAL_COMMUNITY)
Admission: RE | Admit: 2011-11-01 | Discharge: 2011-11-01 | Disposition: A | Discharge status: Home / Self Care | Payer: Medicare Other | Source: Ambulatory Visit | Attending: Family Medicine | Admitting: Family Medicine

## 2011-11-01 DIAGNOSIS — W19XXXA Unspecified fall, initial encounter: Secondary | ICD-10-CM | POA: Insufficient documentation

## 2011-11-01 DIAGNOSIS — S8990XA Unspecified injury of unspecified lower leg, initial encounter: Secondary | ICD-10-CM | POA: Insufficient documentation

## 2011-11-01 DIAGNOSIS — M25579 Pain in unspecified ankle and joints of unspecified foot: Secondary | ICD-10-CM | POA: Insufficient documentation

## 2012-01-11 ENCOUNTER — Emergency Department (HOSPITAL_COMMUNITY): Payer: Medicare Other

## 2012-01-11 ENCOUNTER — Emergency Department (HOSPITAL_COMMUNITY)
Admission: EM | Admit: 2012-01-11 | Discharge: 2012-01-11 | Disposition: A | Discharge status: Home / Self Care | Payer: Medicare Other | Attending: Emergency Medicine | Admitting: Emergency Medicine

## 2012-01-11 ENCOUNTER — Encounter (HOSPITAL_COMMUNITY): Payer: Self-pay | Admitting: *Deleted

## 2012-01-11 DIAGNOSIS — M25539 Pain in unspecified wrist: Secondary | ICD-10-CM | POA: Insufficient documentation

## 2012-01-11 DIAGNOSIS — J45909 Unspecified asthma, uncomplicated: Secondary | ICD-10-CM | POA: Insufficient documentation

## 2012-01-11 DIAGNOSIS — M65839 Other synovitis and tenosynovitis, unspecified forearm: Secondary | ICD-10-CM | POA: Insufficient documentation

## 2012-01-11 DIAGNOSIS — F17200 Nicotine dependence, unspecified, uncomplicated: Secondary | ICD-10-CM | POA: Insufficient documentation

## 2012-01-11 DIAGNOSIS — M778 Other enthesopathies, not elsewhere classified: Secondary | ICD-10-CM

## 2012-01-11 MED ORDER — MELOXICAM 7.5 MG PO TABS: 7.5000 mg | ORAL_TABLET | Freq: Every day | ORAL | Status: AC

## 2012-01-11 NOTE — ED Notes (Signed)
Left wrist pain for 2 days, no known injury

## 2012-01-11 NOTE — Discharge Instructions (Signed)
Please use the wrist splint for the next 7 to 10 days. Mobic 2 times daily with food.Tendinitis Tendinitis is swelling and inflammation of the tendons. Tendons are band-like tissues that connect muscle to bone. Tendinitis commonly occurs in the:   Shoulders (rotator cuff).   Heels (Achilles tendon).   Elbows (triceps tendon).  CAUSES Tendinitis is usually caused by overusing the tendon, muscles, and joints involved. When the tissue surrounding a tendon (synovium) becomes inflamed, it is called tenosynovitis. Tendinitis commonly develops in people whose jobs require repetitive motions. SYMPTOMS  Pain.   Tenderness.   Mild swelling.  DIAGNOSIS Tendinitis is usually diagnosed by physical exam. Your caregiver may also order X-rays or other imaging tests. TREATMENT Your caregiver may recommend certain medicines or exercises for your treatment. HOME CARE INSTRUCTIONS   Use a sling or splint for as long as directed by your caregiver until the pain decreases.   Put ice on the injured area.   Put ice in a plastic bag.   Place a towel between your skin and the bag.   Leave the ice on for 15 to 20 minutes, 3 to 4 times a day.   Avoid using the limb while the tendon is painful. Perform gentle range of motion exercises only as directed by your caregiver. Stop exercises if pain or discomfort increase, unless directed otherwise by your caregiver.   Only take over-the-counter or prescription medicines for pain, discomfort, or fever as directed by your caregiver.  SEEK MEDICAL CARE IF:   Your pain and swelling increase.   You develop new, unexplained symptoms, especially increased numbness in the hands.  MAKE SURE YOU:   Understand these instructions.   Will watch your condition.   Will get help right away if you are not doing well or get worse.  Document Released: 09/08/2000 Document Revised: 08/31/2011 Document Reviewed: 11/28/2010 [facility] Patient Information 2012 [place],  [place].

## 2012-01-20 NOTE — ED Provider Notes (Signed)
History     CSN: [id number]  Arrival date & time 01/11/12  1428   First MD Initiated Contact with Patient 01/11/12 1610      Chief Complaint  Patient presents with  . Wrist Pain    (Consider location/radiation/quality/duration/timing/severity/associated sxs/prior treatment) Patient is a 41 y.o. male presenting with wrist pain. The history is provided by the patient.  Wrist Pain This is a new problem. The current episode started in the past 7 days. The problem occurs constantly. The problem has been gradually worsening. Associated symptoms include arthralgias. Pertinent negatives include no abdominal pain, chest pain, coughing, neck pain or numbness. Exacerbated by: movement and certain positions. He has tried acetaminophen for the symptoms. The treatment provided no relief.    Past Medical History  Diagnosis Date  . Asthma   . Seizures     Past Surgical History  Procedure Date  . Cystoscopy with biopsy 08/15/2011    Procedure: CYSTOSCOPY WITH BIOPSY;  Surgeon: Ky Barban;  Location: [facility];  Service: Urology;  Laterality: N/A;  Cystoscopy/Cystogram with Bladder Bx    No family history on file.  History  Substance Use Topics  . Smoking status: Current Everyday Smoker -- 1.0 packs/day for 15 years  . Smokeless tobacco: Not on file  . Alcohol Use: No      Review of Systems  Constitutional: Negative for activity change.       All ROS Neg except as noted in HPI  HENT: Negative for nosebleeds and neck pain.   Eyes: Negative for photophobia and discharge.  Respiratory: Negative for cough, shortness of breath and wheezing.   Cardiovascular: Negative for chest pain and palpitations.  Gastrointestinal: Negative for abdominal pain and blood in stool.  Genitourinary: Negative for dysuria, frequency and hematuria.  Musculoskeletal: Positive for arthralgias. Negative for back pain.  Skin: Negative.   Neurological: Negative for dizziness, seizures, speech difficulty and  numbness.  Psychiatric/Behavioral: Negative for hallucinations and confusion.    Allergies  Review of patient's allergies indicates no known allergies.  Home Medications   Current Outpatient Rx  Name Route Sig Dispense Refill  . ALBUTEROL SULFATE HFA 108 (90 BASE) MCG/ACT IN AERS Inhalation Inhale 2 puffs into the lungs every 6 (six) hours as needed.      Marland Kitchen HYDROCORTISONE 1 % EX CREA Topical Apply 1 application topically 2 (two) times daily as needed. For skin rash     . MELOXICAM 7.5 MG PO TABS Oral Take 1 tablet (7.5 mg total) by mouth daily. 14 tablet 0    BP 114/72  Pulse 78  Temp 98.3 F (36.8 C)  Resp 20  Ht 5\' 6"  (1.676 m)  Wt 140 lb (63.504 kg)  BMI 22.60 kg/m2  SpO2 96%  Physical Exam  Nursing note and vitals reviewed. Constitutional: He is oriented to person, place, and time. He appears well-developed and well-nourished.  Non-toxic appearance.  HENT:  Head: Normocephalic.  Right Ear: Tympanic membrane and external ear normal.  Left Ear: Tympanic membrane and external ear normal.  Eyes: EOM and lids are normal. Pupils are equal, round, and reactive to light.  Neck: Normal range of motion. Neck supple. Carotid bruit is not present.  Cardiovascular: Normal rate, regular rhythm, normal heart sounds, intact distal pulses and normal pulses.   Pulmonary/Chest: Breath sounds normal. No respiratory distress.  Abdominal: Soft. Bowel sounds are normal. There is no tenderness. There is no guarding.  Musculoskeletal: Normal range of motion.       Pain  with pronation of the left wrist, extending into the forearm. Palmar arch filling in 4 sec.  Lymphadenopathy:       Head (right side): No submandibular adenopathy present.       Head (left side): No submandibular adenopathy present.    He has no cervical adenopathy.  Neurological: He is alert and oriented to person, place, and time. He has normal strength. No cranial nerve deficit or sensory deficit.  Skin: Skin is warm and  dry.  Psychiatric: He has a normal mood and affect. His speech is normal.    ED Course  Procedures (including critical care time)  Labs Reviewed - No data to display No results found.   1. Tendonitis of wrist, left       MDM  **I have reviewed nursing notes, vital signs, and all appropriate lab and imaging results for this patient.* Rx for Mobic and wrist splint given to the patient.       Kathie Dike, [place] 01/20/12 [phone]

## 2012-01-20 NOTE — ED Provider Notes (Signed)
Medical screening examination/treatment/procedure(s) were performed by non-physician practitioner and as supervising physician I was immediately available for consultation/collaboration.  Devoria Albe, MD, FACEP   Ward Givens, MD 01/20/12 1003

## 2012-05-08 ENCOUNTER — Emergency Department (HOSPITAL_COMMUNITY)
Admission: EM | Admit: 2012-05-08 | Discharge: 2012-05-08 | Disposition: A | Discharge status: Home / Self Care | Payer: Medicare Other | Attending: Emergency Medicine | Admitting: Emergency Medicine

## 2012-05-08 ENCOUNTER — Emergency Department (HOSPITAL_COMMUNITY): Payer: Medicare Other

## 2012-05-08 ENCOUNTER — Encounter (HOSPITAL_COMMUNITY): Payer: Self-pay | Admitting: *Deleted

## 2012-05-08 DIAGNOSIS — J45909 Unspecified asthma, uncomplicated: Secondary | ICD-10-CM | POA: Insufficient documentation

## 2012-05-08 DIAGNOSIS — Y92009 Unspecified place in unspecified non-institutional (private) residence as the place of occurrence of the external cause: Secondary | ICD-10-CM | POA: Insufficient documentation

## 2012-05-08 DIAGNOSIS — Z23 Encounter for immunization: Secondary | ICD-10-CM | POA: Insufficient documentation

## 2012-05-08 DIAGNOSIS — R569 Unspecified convulsions: Secondary | ICD-10-CM | POA: Insufficient documentation

## 2012-05-08 DIAGNOSIS — F1020 Alcohol dependence, uncomplicated: Secondary | ICD-10-CM | POA: Insufficient documentation

## 2012-05-08 DIAGNOSIS — F79 Unspecified intellectual disabilities: Secondary | ICD-10-CM | POA: Insufficient documentation

## 2012-05-08 DIAGNOSIS — IMO0002 Reserved for concepts with insufficient information to code with codable children: Secondary | ICD-10-CM | POA: Insufficient documentation

## 2012-05-08 DIAGNOSIS — F17200 Nicotine dependence, unspecified, uncomplicated: Secondary | ICD-10-CM | POA: Insufficient documentation

## 2012-05-08 DIAGNOSIS — S60229A Contusion of unspecified hand, initial encounter: Secondary | ICD-10-CM | POA: Insufficient documentation

## 2012-05-08 HISTORY — DX: Alcohol dependence, uncomplicated: F10.20

## 2012-05-08 HISTORY — DX: Unspecified intellectual disabilities: F79

## 2012-05-08 MED ORDER — NAPROXEN 500 MG PO TABS: 500.0000 mg | ORAL_TABLET | Freq: Two times a day (BID) | ORAL | Status: AC

## 2012-05-08 MED ORDER — CEPHALEXIN 500 MG PO CAPS: 500.0000 mg | ORAL_CAPSULE | Freq: Four times a day (QID) | ORAL | Status: AC

## 2012-05-08 MED ORDER — TETANUS-DIPHTH-ACELL PERTUSSIS 5-2.5-18.5 LF-MCG/0.5 IM SUSP
0.5000 mL | Freq: Once | INTRAMUSCULAR | Status: AC
  Administered 2012-05-08: 0.5 mL via INTRAMUSCULAR
  Filled 2012-05-08: qty 0.5

## 2012-05-08 MED ORDER — CEPHALEXIN 500 MG PO CAPS
500.0000 mg | ORAL_CAPSULE | Freq: Once | ORAL | Status: AC
  Administered 2012-05-08: 500 mg via ORAL
  Filled 2012-05-08: qty 1

## 2012-05-08 MED ORDER — NAPROXEN 250 MG PO TABS
500.0000 mg | ORAL_TABLET | Freq: Once | ORAL | Status: AC
  Administered 2012-05-08: 500 mg via ORAL
  Filled 2012-05-08: qty 2

## 2012-05-08 NOTE — ED Notes (Signed)
Pt seen by EDPa for initial assessment. 

## 2012-05-08 NOTE — ED Notes (Addendum)
Fell on Tuesday, injurying lt hand , swelling present  Living in a group home.

## 2012-05-08 NOTE — ED Provider Notes (Signed)
History     CSN: [id number]  Arrival date & time 05/08/12  1207   First MD Initiated Contact with Patient 05/08/12 1302      Chief Complaint  Patient presents with  . Hand Injury    (Consider location/radiation/quality/duration/timing/severity/associated sxs/prior treatment) HPI Comments: Patient c/o pain and swelling to the dorsal left hand for several days.  Pt has hx of MR and is currently staying in an assisted living facility.  States he is unsure of what he struck.  Pain is worse with movemetn of his hand and improves with rest.  He denies numbness, weakness or wrist pain.    Patient is a 41 y.o. male presenting with hand injury. The history is provided by the patient.  Hand Injury  The incident occurred more than 2 days ago. The incident occurred at home. The injury mechanism was a direct blow. The pain is present in the left hand. The quality of the pain is described as aching and throbbing. The pain is moderate. The pain has been constant since the incident. Pertinent negatives include no fever and no malaise/fatigue. He reports no foreign bodies present. The symptoms are aggravated by use and palpation. He has tried nothing for the symptoms. The treatment provided no relief.    Past Medical History  Diagnosis Date  . Asthma   . Seizures   . Mental retardation   . Alcohol dependence     Past Surgical History  Procedure Date  . Cystoscopy with biopsy 08/15/2011    Procedure: CYSTOSCOPY WITH BIOPSY;  Surgeon: Ky Barban;  Location: [facility];  Service: Urology;  Laterality: N/A;  Cystoscopy/Cystogram with Bladder Bx    History reviewed. No pertinent family history.  History  Substance Use Topics  . Smoking status: Current Everyday Smoker -- 1.0 packs/day for 15 years  . Smokeless tobacco: Not on file  . Alcohol Use: No      Review of Systems  Constitutional: Negative for fever, chills and malaise/fatigue.  Genitourinary: Negative for dysuria and difficulty  urinating.  Musculoskeletal: Positive for joint swelling and arthralgias.  Skin: Positive for wound. Negative for color change.       abrasion  All other systems reviewed and are negative.    Allergies  Review of patient's allergies indicates no known allergies.  Home Medications   Current Outpatient Rx  Name Route Sig Dispense Refill  . ALBUTEROL SULFATE HFA 108 (90 BASE) MCG/ACT IN AERS Inhalation Inhale 2 puffs into the lungs every 6 (six) hours as needed.      . CEPHALEXIN 500 MG PO CAPS Oral Take 1 capsule (500 mg total) by mouth 4 (four) times daily. 28 capsule 0  . HYDROCORTISONE 1 % EX CREA Topical Apply 1 application topically 2 (two) times daily as needed. For skin rash     . MELOXICAM 7.5 MG PO TABS Oral Take 1 tablet (7.5 mg total) by mouth daily. 14 tablet 0  . NAPROXEN 500 MG PO TABS Oral Take 1 tablet (500 mg total) by mouth 2 (two) times daily. Prn pain.  Take with food 30 tablet 0    BP 113/74  Pulse 72  Temp 97.9 F (36.6 C) (Oral)  Resp 18  Ht 5\' 6"  (1.676 m)  Wt 140 lb (63.504 kg)  BMI 22.60 kg/m2  SpO2 96%  Physical Exam  Nursing note and vitals reviewed. Constitutional: He is oriented to person, place, and time. He appears well-developed and well-nourished. No distress.  HENT:  Head: Normocephalic  and atraumatic.  Cardiovascular: Normal rate, regular rhythm, normal heart sounds and intact distal pulses.   No murmur heard. Pulmonary/Chest: Effort normal and breath sounds normal. No respiratory distress.  Musculoskeletal: He exhibits edema and tenderness.       Left hand: He exhibits tenderness and swelling. He exhibits normal range of motion, no bony tenderness, normal two-point discrimination, normal capillary refill, no deformity and no laceration. normal sensation noted. Normal strength noted.       Hands: Neurological: He is alert and oriented to person, place, and time. He exhibits normal muscle tone. Coordination normal.  Skin: Skin is warm and  dry.    ED Course  Procedures (including critical care time)  Labs Reviewed - No data to display Dg Hand Complete Left  05/08/2012  *RADIOLOGY REPORT*  Clinical Data: Fall.  Metacarpal pain.  LEFT HAND - COMPLETE 3+ VIEW  Comparison: 11/18/2003.  Findings: There is an old healed fracture of the distal fifth metacarpal.  No sign of acute fracture.  There is soft tissue swelling of the hand.  No degenerative arthritis is evident.  IMPRESSION: No acute bony finding.  Old healed fifth metacarpal fracture.  Soft tissue swelling.  Original Report Authenticated By: Thomasenia Sales, M.D.     1. Contusion, hand       MDM   bulky dressing applied, pain improved, remains NV intact    Consulted Oneal Grout of [facility].  She was unaware of Jeffery's last TDaP, so was updated today.  STS of the dorsal hand w/o bony deformity.  Old appearing abrasion to the hand that appears to be healing.  No signs of infection  The patient appears reasonably screened and/or stabilized for discharge and I doubt any other medical condition or other [facility] requiring further screening, evaluation, or treatment in the ED at this time prior to discharge.    Prescribed: naprosyn  Ji Feldner L. [place], [place] 05/11/12 2215

## 2012-05-12 NOTE — ED Provider Notes (Signed)
Medical screening examination/treatment/procedure(s) were performed by non-physician practitioner and as supervising physician I was immediately available for consultation/collaboration.   Iosefa Weintraub L Dejohn Ibarra, MD 05/12/12 1234 

## 2016-02-23 ENCOUNTER — Other Ambulatory Visit (HOSPITAL_COMMUNITY): Payer: Self-pay | Admitting: Internal Medicine

## 2016-02-24 ENCOUNTER — Other Ambulatory Visit (HOSPITAL_COMMUNITY): Payer: Self-pay | Admitting: Respiratory Therapy

## 2016-02-24 DIAGNOSIS — F17210 Nicotine dependence, cigarettes, uncomplicated: Secondary | ICD-10-CM

## 2016-03-01 ENCOUNTER — Ambulatory Visit (HOSPITAL_COMMUNITY): Admission: RE | Admit: 2016-03-01 | Payer: Medicare Other | Source: Ambulatory Visit

## 2016-03-02 ENCOUNTER — Other Ambulatory Visit (HOSPITAL_COMMUNITY): Payer: Self-pay | Admitting: Internal Medicine

## 2016-03-02 DIAGNOSIS — J449 Chronic obstructive pulmonary disease, unspecified: Secondary | ICD-10-CM

## 2016-03-20 ENCOUNTER — Other Ambulatory Visit (HOSPITAL_COMMUNITY): Payer: Self-pay | Admitting: Respiratory Therapy

## 2016-03-20 DIAGNOSIS — J449 Chronic obstructive pulmonary disease, unspecified: Secondary | ICD-10-CM

## 2016-03-20 DIAGNOSIS — F17210 Nicotine dependence, cigarettes, uncomplicated: Secondary | ICD-10-CM

## 2016-04-05 ENCOUNTER — Ambulatory Visit (HOSPITAL_COMMUNITY)
Admission: RE | Admit: 2016-04-05 | Discharge: 2016-04-05 | Disposition: A | Discharge status: Home / Self Care | Payer: Medicare Other | Source: Ambulatory Visit | Attending: Internal Medicine | Admitting: Internal Medicine

## 2016-04-05 DIAGNOSIS — J449 Chronic obstructive pulmonary disease, unspecified: Secondary | ICD-10-CM | POA: Insufficient documentation

## 2016-04-05 DIAGNOSIS — F17210 Nicotine dependence, cigarettes, uncomplicated: Secondary | ICD-10-CM | POA: Insufficient documentation

## 2016-04-05 LAB — SPIROMETRY WITH GRAPH
FEF 25-75 PRE: 1.06 L/s
FEF2575-%Pred-Pre: 30 %
FEV1-%Pred-Pre: 51 %
FEV1-Pre: 1.86 L
FEV1FVC-%PRED-PRE: 69 %
FEV6-%Pred-Pre: 75 %
FEV6-Pre: 3.34 L
FEV6FVC-%PRED-PRE: 101 %
FVC-%Pred-Pre: 74 %
FVC-Pre: 3.39 L
PRE FEV1/FVC RATIO: 55 %
Pre FEV6/FVC Ratio: 99 %

## 2016-05-25 ENCOUNTER — Emergency Department (HOSPITAL_COMMUNITY)
Admission: EM | Admit: 2016-05-25 | Discharge: 2016-05-25 | Disposition: A | Discharge status: Home / Self Care | Payer: Medicare Other | Attending: Emergency Medicine | Admitting: Emergency Medicine

## 2016-05-25 ENCOUNTER — Encounter (HOSPITAL_COMMUNITY): Payer: Self-pay | Admitting: *Deleted

## 2016-05-25 ENCOUNTER — Emergency Department (HOSPITAL_COMMUNITY): Payer: Medicare Other

## 2016-05-25 DIAGNOSIS — F17210 Nicotine dependence, cigarettes, uncomplicated: Secondary | ICD-10-CM | POA: Diagnosis not present

## 2016-05-25 DIAGNOSIS — J449 Chronic obstructive pulmonary disease, unspecified: Secondary | ICD-10-CM | POA: Insufficient documentation

## 2016-05-25 DIAGNOSIS — Z79899 Other long term (current) drug therapy: Secondary | ICD-10-CM | POA: Insufficient documentation

## 2016-05-25 DIAGNOSIS — J45909 Unspecified asthma, uncomplicated: Secondary | ICD-10-CM | POA: Insufficient documentation

## 2016-05-25 DIAGNOSIS — R0789 Other chest pain: Secondary | ICD-10-CM | POA: Diagnosis present

## 2016-05-25 HISTORY — DX: Chronic obstructive pulmonary disease, unspecified: J44.9

## 2016-05-25 LAB — CBC
HCT: 43.9 % (ref 39.0–52.0)
HEMOGLOBIN: 14.9 g/dL (ref 13.0–17.0)
MCH: 31.9 pg (ref 26.0–34.0)
MCHC: 33.9 g/dL (ref 30.0–36.0)
MCV: 94 fL (ref 78.0–100.0)
Platelets: 165 10*3/uL (ref 150–400)
RBC: 4.67 MIL/uL (ref 4.22–5.81)
RDW: 13.2 % (ref 11.5–15.5)
WBC: 6.9 10*3/uL (ref 4.0–10.5)

## 2016-05-25 LAB — TROPONIN I

## 2016-05-25 LAB — BASIC METABOLIC PANEL
ANION GAP: 5 (ref 5–15)
BUN: 9 mg/dL (ref 6–20)
CALCIUM: 9.1 mg/dL (ref 8.9–10.3)
CO2: 27 mmol/L (ref 22–32)
Chloride: 107 mmol/L (ref 101–111)
Creatinine, Ser: 0.7 mg/dL (ref 0.61–1.24)
Glucose, Bld: 97 mg/dL (ref 65–99)
Potassium: 4 mmol/L (ref 3.5–5.1)
Sodium: 139 mmol/L (ref 135–145)

## 2016-05-25 MED ORDER — IBUPROFEN 400 MG PO TABS
400.0000 mg | ORAL_TABLET | Freq: Once | ORAL | Status: AC
  Administered 2016-05-25: 400 mg via ORAL
  Filled 2016-05-25: qty 1

## 2016-05-25 MED ORDER — ACETAMINOPHEN 325 MG PO TABS
650.0000 mg | ORAL_TABLET | Freq: Once | ORAL | Status: AC
  Administered 2016-05-25: 650 mg via ORAL
  Filled 2016-05-25: qty 2

## 2016-05-25 NOTE — ED Triage Notes (Signed)
Pt comes in by [facility] for chest pain that started yesterday. Pain is worse with movement and when he coughs. Pt states he began having a cough today. Pt has IV established and has been given 324 ASA and 1 nitro with no relief.

## 2016-05-25 NOTE — ED Provider Notes (Signed)
[facility] Provider Note   CSN: [id number] Arrival date & time: 05/25/16  1220     History   Chief Complaint Chief Complaint  Patient presents with  . Chest Pain    HPI [name] is a 45 y.o. male.  HPI  Pt was seen at 1300. Per pt, c/o gradual onset and persistence of constant left sided chest wall "pain" for the past 2 days. Pain is described as "sharp" and "stabbing," worsens with palpation of the area and body position changes. Pt states the pain began after he "mowed the lawn yesterday." Denies chest discomfort while mowing the lawn/during exertion. Denies SOB/cough, no palpitations, no abd pain, no N/V/D, no fevers, no direct injury.    Past Medical History:  Diagnosis Date  . Alcohol dependence (HCC)   . Asthma   . COPD (chronic obstructive pulmonary disease) (HCC)   . Mental retardation   . Seizures (HCC)     There are no active problems to display for this patient.   Past Surgical History:  Procedure Laterality Date  . CYSTOSCOPY WITH BIOPSY  08/15/2011   Procedure: CYSTOSCOPY WITH BIOPSY;  Surgeon: Ky Barban;  Location: [facility];  Service: Urology;  Laterality: N/A;  Cystoscopy/Cystogram with Bladder Bx       Home Medications    Prior to Admission medications   Medication Sig Start Date End Date Taking? Authorizing Provider  albuterol (PROVENTIL HFA;VENTOLIN HFA) 108 (90 BASE) MCG/ACT inhaler Inhale 2 puffs into the lungs every 6 (six) hours as needed.     Yes Historical Provider, MD  lovastatin (MEVACOR) 40 MG tablet Take 1 tablet by mouth at bedtime. 05/20/16  Yes Historical Provider, MD  STIOLTO RESPIMAT 2.5-2.5 MCG/ACT AERS Inhale 1 puff into the lungs daily. 03/20/16  Yes Historical Provider, MD    Family History   Social History Social History  Substance Use Topics  . Smoking status: Current Every Day Smoker    Packs/day: 2.00    Years: 15.00  . Smokeless tobacco: Current User    Types: Chew  . Alcohol use No      Allergies   Review of patient's allergies indicates no known allergies.   Review of Systems Review of Systems ROS: Statement: All systems negative except as marked or noted in the HPI; Constitutional: Negative for fever and chills. ; ; Eyes: Negative for eye pain, redness and discharge. ; ; ENMT: Negative for ear pain, hoarseness, nasal congestion, sinus pressure and sore throat. ; ; Cardiovascular: Negative for palpitations, diaphoresis, dyspnea and peripheral edema. ; ; Respiratory: Negative for cough, wheezing and stridor. ; ; Gastrointestinal: Negative for nausea, vomiting, diarrhea, abdominal pain, blood in stool, hematemesis, jaundice and rectal bleeding. . ; ; Genitourinary: Negative for dysuria, flank pain and hematuria. ; ; Musculoskeletal: +chest wall pain. Negative for back pain and neck pain. Negative for swelling and trauma.; ; Skin: Negative for pruritus, rash, abrasions, blisters, bruising and skin lesion.; ; Neuro: Negative for headache, lightheadedness and neck stiffness. Negative for weakness, altered level of consciousness, altered mental status, extremity weakness, paresthesias, involuntary movement, seizure and syncope.       Physical Exam Updated Vital Signs BP 97/71   Pulse 68   Temp 98.2 F (36.8 C) (Oral)   Resp 22   Ht 5\' 6"  (1.676 m)   Wt 147 lb (66.7 kg)   SpO2 95%   BMI 23.73 kg/m   Physical Exam 1305: Physical examination:  Nursing notes reviewed; Vital signs and O2  SAT reviewed;  Constitutional: Well developed, Well nourished, Well hydrated, In no acute distress; Head:  Normocephalic, atraumatic; Eyes: EOMI, PERRL, No scleral icterus; ENMT: Mouth and pharynx normal, Mucous membranes moist; Neck: Supple, Full range of motion, No lymphadenopathy; Cardiovascular: Regular rate and rhythm, No murmur, rub, or gallop; Respiratory: Breath sounds clear & equal bilaterally, No rales, rhonchi, wheezes.  Speaking full sentences with ease, Normal respiratory  effort/excursion; Chest: +left lower anterior chest wall tender to palp. No soft tissue crepitus, no deformity. Movement normal; Abdomen: Soft, Nontender, Nondistended, Normal bowel sounds; Genitourinary: No CVA tenderness; Extremities: Pulses normal, No tenderness, No edema, No calf edema or asymmetry.; Neuro: AA&Ox3, Major CN grossly intact.  Speech clear. No gross focal motor or sensory deficits in extremities.; Skin: Color normal, Warm, Dry.   ED Treatments / Results  Labs (all labs ordered are listed, but only abnormal results are displayed)   EKG  EKG Interpretation  Date/Time:  Thursday May 25 2016 12:21:36 EDT Ventricular Rate:  79 PR Interval:    QRS Duration: 93 QT Interval:  362 QTC Calculation: 415 R Axis:   76 Text Interpretation:  Sinus rhythm No old tracing to compare Confirmed by [facility]  MD, Nicholos Johns [id number]) on 05/25/2016 1:22:04 PM       Radiology   Procedures Procedures (including critical care time)  Medications Ordered in ED Medications  acetaminophen (TYLENOL) tablet 650 mg (650 mg Oral Given 05/25/16 1318)  ibuprofen (ADVIL,MOTRIN) tablet 400 mg (400 mg Oral Given 05/25/16 1318)     Initial Impression / Assessment and Plan / ED Course  I have reviewed the triage vital signs and the nursing notes.  Pertinent labs & imaging results that were available during my care of the patient were reviewed by me and considered in my medical decision making (see chart for details).  MDM Reviewed: previous chart, nursing note and vitals Reviewed previous: labs Interpretation: labs, ECG and x-ray   Results for orders placed or performed during the hospital encounter of 05/25/16  Basic metabolic panel  Result Value Ref Range   Sodium 139 135 - 145 mmol/L   Potassium 4.0 3.5 - 5.1 mmol/L   Chloride 107 101 - 111 mmol/L   CO2 27 22 - 32 mmol/L   Glucose, Bld 97 65 - 99 mg/dL   BUN 9 6 - 20 mg/dL   Creatinine, Ser 4.78 0.61 - 1.24 mg/dL   Calcium 9.1 8.9 -  29.5 mg/dL   GFR calc non Af Amer >60 >60 mL/min   GFR calc Af Amer >60 >60 mL/min   Anion gap 5 5 - 15  CBC  Result Value Ref Range   WBC 6.9 4.0 - 10.5 K/uL   RBC 4.67 4.22 - 5.81 MIL/uL   Hemoglobin 14.9 13.0 - 17.0 g/dL   HCT 62.1 30.8 - 65.7 %   MCV 94.0 78.0 - 100.0 fL   MCH 31.9 26.0 - 34.0 pg   MCHC 33.9 30.0 - 36.0 g/dL   RDW 84.6 96.2 - 95.2 %   Platelets 165 150 - 400 K/uL  Troponin I  Result Value Ref Range   Troponin I <0.03 <0.03 ng/mL   Dg Chest 2 View Result Date: 05/25/2016 CLINICAL DATA:  45 year old male with left chest pain onset today. Cough. Smoker. Initial encounter. EXAM: CHEST  2 VIEW COMPARISON:  04/13/2008 and earlier. FINDINGS: Mediastinal contours remain normal. Lung volumes are stable at the upper limits of normal to mildly hyperinflated. There is some increased AP dimension to  the chest. No pneumothorax, pulmonary edema, pleural effusion or confluent pulmonary opacity. Mildly increased interstitial markings since 2009, but favor chronic. No acute osseous abnormality identified. IMPRESSION: A degree of chronic pulmonary hyperinflation is suspected. No acute cardiopulmonary abnormality. Electronically Signed   By: Odessa FlemingH  Hall M.D.   On: 05/25/2016 13:01    1420:  Pt asleep after tylenol/motrin. Doubt PE as cause for symptoms with low risk [place].  Doubt ACS as cause for symptoms with normal troponin and unchanged EKG from previous after 2 days of constant atypical symptoms. Tx symptomatically at this time. Dx and testing d/w pt.  Questions answered.  Verb understanding, agreeable to d/c home with outpt f/u.   Final Clinical Impressions(s) / ED Diagnoses   Final diagnoses:  None    New Prescriptions New Prescriptions   No medications on file     Samuel JesterKathleen Lirio Bach, DO 05/27/16 2013

## 2016-05-25 NOTE — Discharge Instructions (Signed)
Take over the counter tylenol and ibuprofen, as directed on packaging, as needed for discomfort.  Apply moist heat or ice to the area(s) of discomfort, for 15 minutes at a time, several times per day for the next few days.  Do not fall asleep on a heating or ice pack.  Call your regular medical doctor today to schedule a follow up appointment in the next 2 days.  Return to the Emergency Department immediately if worsening.

## 2016-08-29 ENCOUNTER — Encounter (HOSPITAL_COMMUNITY): Payer: Self-pay | Admitting: *Deleted

## 2016-08-29 ENCOUNTER — Emergency Department (HOSPITAL_COMMUNITY)
Admission: EM | Admit: 2016-08-29 | Discharge: 2016-08-29 | Disposition: A | Discharge status: Home / Self Care | Payer: Medicare Other | Attending: Emergency Medicine | Admitting: Emergency Medicine

## 2016-08-29 ENCOUNTER — Emergency Department (HOSPITAL_COMMUNITY): Payer: Medicare Other

## 2016-08-29 DIAGNOSIS — J45909 Unspecified asthma, uncomplicated: Secondary | ICD-10-CM | POA: Insufficient documentation

## 2016-08-29 DIAGNOSIS — F17220 Nicotine dependence, chewing tobacco, uncomplicated: Secondary | ICD-10-CM | POA: Insufficient documentation

## 2016-08-29 DIAGNOSIS — Z79899 Other long term (current) drug therapy: Secondary | ICD-10-CM | POA: Diagnosis not present

## 2016-08-29 DIAGNOSIS — W010XXA Fall on same level from slipping, tripping and stumbling without subsequent striking against object, initial encounter: Secondary | ICD-10-CM | POA: Diagnosis not present

## 2016-08-29 DIAGNOSIS — Y939 Activity, unspecified: Secondary | ICD-10-CM | POA: Insufficient documentation

## 2016-08-29 DIAGNOSIS — J449 Chronic obstructive pulmonary disease, unspecified: Secondary | ICD-10-CM | POA: Insufficient documentation

## 2016-08-29 DIAGNOSIS — S59901A Unspecified injury of right elbow, initial encounter: Secondary | ICD-10-CM | POA: Diagnosis present

## 2016-08-29 DIAGNOSIS — S52124A Nondisplaced fracture of head of right radius, initial encounter for closed fracture: Secondary | ICD-10-CM | POA: Diagnosis not present

## 2016-08-29 DIAGNOSIS — Y999 Unspecified external cause status: Secondary | ICD-10-CM | POA: Diagnosis not present

## 2016-08-29 DIAGNOSIS — Y929 Unspecified place or not applicable: Secondary | ICD-10-CM | POA: Insufficient documentation

## 2016-08-29 MED ORDER — HYDROCODONE-ACETAMINOPHEN 5-325 MG PO TABS: 1.0000 | ORAL_TABLET | ORAL | 0 refills | Status: DC | PRN

## 2016-08-29 NOTE — ED Triage Notes (Addendum)
Pt from [facility] and reports he fell Sunday and landed on his right arm causing right upper arm and right elbow pain. Pt is unable to fully extend the right arm. Denies LOC upon fall.

## 2016-08-29 NOTE — ED Notes (Addendum)
[name] [phone]. Spoke with [name]-d/c instructions given, Mr [name] states they will provide d/c transportation and to have patient wait in waiting room.

## 2016-08-29 NOTE — ED Provider Notes (Signed)
[facility] Provider Note   CSN: [id number] Arrival date & time: 08/29/16  0910     History   Chief Complaint Chief Complaint  Patient presents with  . Arm Pain    HPI [name] is a 45 y.o. male who lives in a group home presenting with a painful right elbow since tripping and falling directly on the elbow 2 days ago.  He reports worsened pain with movement and denies any pain in his hand, wrist or shoulder and denies hitting his head.  He has had no medicines or treatment prior to arriving here.  The history is provided by the patient and a caregiver. The history is limited by a developmental delay.    Past Medical History:  Diagnosis Date  . Alcohol dependence (HCC)   . Asthma   . COPD (chronic obstructive pulmonary disease) (HCC)   . Mental retardation   . Seizures (HCC)     There are no active problems to display for this patient.   Past Surgical History:  Procedure Laterality Date  . CYSTOSCOPY WITH BIOPSY  08/15/2011   Procedure: CYSTOSCOPY WITH BIOPSY;  Surgeon: Ky BarbanMohammad I Javaid;  Location: [facility];  Service: Urology;  Laterality: N/A;  Cystoscopy/Cystogram with Bladder Bx       Home Medications    Prior to Admission medications   Medication Sig Start Date End Date Taking? Authorizing Provider  lovastatin (MEVACOR) 40 MG tablet Take 1 tablet by mouth at bedtime. 05/20/16  Yes Historical Provider, MD  STIOLTO RESPIMAT 2.5-2.5 MCG/ACT AERS Inhale 1 puff into the lungs daily. 03/20/16  Yes Historical Provider, MD  albuterol (PROVENTIL HFA;VENTOLIN HFA) 108 (90 BASE) MCG/ACT inhaler Inhale 2 puffs into the lungs every 4 (four) hours as needed for wheezing or shortness of breath.     Historical Provider, MD  HYDROcodone-acetaminophen (NORCO/VICODIN) 5-325 MG tablet Take 1 tablet by mouth every 4 (four) hours as needed. 08/29/16   Burgess AmorJulie Curlee Bogan, PA-C    Family History No family history on file.  Social History Social History  Substance Use Topics  .  Smoking status: Current Every Day Smoker    Packs/day: 2.00    Years: 15.00  . Smokeless tobacco: Current User    Types: Chew  . Alcohol use No     Allergies   Patient has no known allergies.   Review of Systems Review of Systems  Constitutional: Negative for fever.  Musculoskeletal: Positive for arthralgias and joint swelling. Negative for myalgias.  Neurological: Negative for weakness and numbness.     Physical Exam Updated Vital Signs BP 118/85   Pulse 88   Temp 98.3 F (36.8 C) (Oral)   Resp 20   Ht 5\' 6"  (1.676 m)   Wt 68 kg   SpO2 98%   BMI 24.21 kg/m   Physical Exam  Constitutional: He appears well-developed and well-nourished.  HENT:  Head: Atraumatic.  Neck: Normal range of motion.  Cardiovascular:  Pulses:      Radial pulses are 2+ on the right side, and 2+ on the left side.  Pulses equal bilaterally  Musculoskeletal: He exhibits tenderness.       Right elbow: He exhibits swelling. He exhibits no deformity. Tenderness found. Radial head and olecranon process tenderness noted.  Neurological: He is alert. He has normal strength. He displays normal reflexes. No sensory deficit.  Equal grip strength.  Skin: Skin is warm and dry.  Psychiatric: He has a normal mood and affect.  ED Treatments / Results  Labs (all labs ordered are listed, but only abnormal results are displayed) Labs Reviewed - No data to display  EKG  EKG Interpretation None       Radiology Dg Elbow Complete Right  Result Date: 08/29/2016 CLINICAL DATA:  Larey SeatFell yesterday with pain in the elbow and distal humerus EXAM: RIGHT ELBOW - COMPLETE 3+ VIEW COMPARISON:  None. FINDINGS: There is a nondisplaced radial head fracture. The ulna appears intact. There is a moderate size right elbow joint effusion present displacing both anterior and posterior fat pads. No abnormality of the distal humerus is noted. IMPRESSION: Nondisplaced right radial head fracture with moderate size right  elbow joint effusion. Electronically Signed   By: Dwyane DeePaul  Barry M.D.   On: 08/29/2016 09:47   Dg Humerus Right  Result Date: 08/29/2016 CLINICAL DATA:  45 year old male fell yesterday on right arm with pain. Initial encounter. EXAM: RIGHT HUMERUS - 2+ VIEW COMPARISON:  Elbow films same date dictated separately. FINDINGS: No right humerus fracture. Abnormal appearance of the right radial head. Please see elbow report performed same date and dictated separately. Visualized lungs clear. IMPRESSION: No right humerus fracture. Abnormal appearance of the right radial head. Please see elbow report performed same date and dictated separately. Electronically Signed   By: Lacy DuverneySteven  Olson M.D.   On: 08/29/2016 09:53    Procedures Procedures (including critical care time)  Medications Ordered in ED Medications - No data to display   Initial Impression / Assessment and Plan / ED Course  I have reviewed the triage vital signs and the nursing notes.  Pertinent labs & imaging results that were available during my care of the patient were reviewed by me and considered in my medical decision making (see chart for details).  Clinical Course     Sugar tong splint applied, sling provided.  Hydrocodone when necessary.  Patient was referred to Dr. Romeo AppleHarrison for follow-up care.  Caregiver is aware to call for an appointment time.  Final Clinical Impressions(s) / ED Diagnoses   Final diagnoses:  Closed nondisplaced fracture of head of right radius, initial encounter    New Prescriptions New Prescriptions   HYDROCODONE-ACETAMINOPHEN (NORCO/VICODIN) 5-325 MG TABLET    Take 1 tablet by mouth every 4 (four) hours as needed.     Burgess AmorJulie Mariann Palo, PA-C 08/29/16 1106    Bethann BerkshireJoseph Zammit, MD 08/29/16 1556

## 2016-09-05 ENCOUNTER — Ambulatory Visit (INDEPENDENT_AMBULATORY_CARE_PROVIDER_SITE_OTHER): Payer: Medicare Other | Admitting: Orthopaedic Surgery

## 2016-09-05 ENCOUNTER — Encounter: Payer: Self-pay | Admitting: Orthopaedic Surgery

## 2016-09-05 VITALS — BP 119/78 | HR 85 | Temp 97.3°F | Ht 69.0 in | Wt 155.0 lb

## 2016-09-05 DIAGNOSIS — S52124A Nondisplaced fracture of head of right radius, initial encounter for closed fracture: Secondary | ICD-10-CM

## 2016-09-05 DIAGNOSIS — F17210 Nicotine dependence, cigarettes, uncomplicated: Secondary | ICD-10-CM | POA: Diagnosis not present

## 2016-09-05 NOTE — Patient Instructions (Signed)
Steps to Quit Smoking Smoking tobacco can be bad for your health. It can also affect almost every organ in your body. Smoking puts you and people around you at risk for many serious long-lasting (chronic) diseases. Quitting smoking is hard, but it is one of the best things that you can do for your health. It is never too late to quit. What are the benefits of quitting smoking? When you quit smoking, you lower your risk for getting serious diseases and conditions. They can include:  Lung cancer or lung disease.  Heart disease.  Stroke.  Heart attack.  Not being able to have children (infertility).  Weak bones (osteoporosis) and broken bones (fractures). If you have coughing, wheezing, and shortness of breath, those symptoms may get better when you quit. You may also get sick less often. If you are pregnant, quitting smoking can help to lower your chances of having a baby of low birth weight. What can I do to help me quit smoking? Talk with your doctor about what can help you quit smoking. Some things you can do (strategies) include:  Quitting smoking totally, instead of slowly cutting back how much you smoke over a period of time.  Going to in-person counseling. You are more likely to quit if you go to many counseling sessions.  Using resources and support systems, such as:  Online chats with a counselor.  Phone quitlines.  Printed self-help materials.  Support groups or group counseling.  Text messaging programs.  Mobile phone apps or applications.  Taking medicines. Some of these medicines may have nicotine in them. If you are pregnant or breastfeeding, do not take any medicines to quit smoking unless your doctor says it is okay. Talk with your doctor about counseling or other things that can help you. Talk with your doctor about using more than one strategy at the same time, such as taking medicines while you are also going to in-person counseling. This can help make quitting  easier. What things can I do to make it easier to quit? Quitting smoking might feel very hard at first, but there is a lot that you can do to make it easier. Take these steps:  Talk to your family and friends. Ask them to support and encourage you.  Call phone quitlines, reach out to support groups, or work with a counselor.  Ask people who smoke to not smoke around you.  Avoid places that make you want (trigger) to smoke, such as:  Bars.  Parties.  Smoke-break areas at work.  Spend time with people who do not smoke.  Lower the stress in your life. Stress can make you want to smoke. Try these things to help your stress:  Getting regular exercise.  Deep-breathing exercises.  Yoga.  Meditating.  Doing a body scan. To do this, close your eyes, focus on one area of your body at a time from head to toe, and notice which parts of your body are tense. Try to relax the muscles in those areas.  Download or buy apps on your mobile phone or tablet that can help you stick to your quit plan. There are many free apps, such as QuitGuide from the CDC (Centers for Disease Control and Prevention). You can find more support from smokefree.gov and other websites. This information is not intended to replace advice given to you by your health care provider. Make sure you discuss any questions you have with your health care provider. Document Released: 07/08/2009 Document Revised: 05/09/2016 Document   Reviewed: 01/26/2015 Elsevier Interactive Patient Education  2017 [employer]  

## 2016-09-05 NOTE — Progress Notes (Signed)
Subjective:    Patient ID: [name], male    DOB: 1971-04-18, 45 y.o.   MRN: [id number]  HPI He fell and hurt his right elbow on 08-28-16.  He was seen in ER on 08-29-16.  X-rays showed a nondisplaced right radial head fracture of the elbow.  He was placed in a sugar tong splint. He has no other injuries.  I have reviewed the ER reports, the x-rays and x-ray report.  He smokes and is not willing to quit.  He is a resident at [facility].  I have completed forms for them   Review of Systems  HENT: Negative for congestion.   Respiratory: Positive for shortness of breath. Negative for cough.   Cardiovascular: Negative for chest pain and leg swelling.  Endocrine: Negative for cold intolerance.  Musculoskeletal: Positive for arthralgias and joint swelling.  Allergic/Immunologic: Negative for environmental allergies.   Past Medical History:  Diagnosis Date  . Alcohol dependence (HCC)   . Asthma   . COPD (chronic obstructive pulmonary disease) (HCC)   . Mental retardation   . Seizures (HCC)     Past Surgical History:  Procedure Laterality Date  . CYSTOSCOPY WITH BIOPSY  08/15/2011   Procedure: CYSTOSCOPY WITH BIOPSY;  Surgeon: Ky BarbanMohammad I Javaid;  Location: [facility];  Service: Urology;  Laterality: N/A;  Cystoscopy/Cystogram with Bladder Bx    Current Outpatient Prescriptions on File Prior to Visit  Medication Sig Dispense Refill  . albuterol (PROVENTIL HFA;VENTOLIN HFA) 108 (90 BASE) MCG/ACT inhaler Inhale 2 puffs into the lungs every 4 (four) hours as needed for wheezing or shortness of breath.     Marland Kitchen. HYDROcodone-acetaminophen (NORCO/VICODIN) 5-325 MG tablet Take 1 tablet by mouth every 4 (four) hours as needed. 20 tablet 0  . lovastatin (MEVACOR) 40 MG tablet Take 1 tablet by mouth at bedtime.    Marland Kitchen. STIOLTO RESPIMAT 2.5-2.5 MCG/ACT AERS Inhale 1 puff into the lungs daily.     No current facility-administered medications on file prior to visit.     Social History    Social History  . Marital status: Single    Spouse name: N/A  . Number of children: N/A  . Years of education: N/A   Occupational History  . Not on file.   Social History Main Topics  . Smoking status: Current Every Day Smoker    Packs/day: 2.00    Years: 15.00  . Smokeless tobacco: Current User    Types: Chew  . Alcohol use No  . Drug use: No  . Sexual activity: Not on file   Other Topics Concern  . Not on file   Social History Narrative  . No narrative on file    No family history on file.  BP 119/78   Pulse 85   Temp 97.3 F (36.3 C)   Ht 5\' 9"  (1.753 m)   Wt 155 lb (70.3 kg)   BMI 22.89 kg/m      Objective:   Physical Exam  Constitutional: He is oriented to person, place, and time. He appears well-developed and well-nourished.  HENT:  Head: Normocephalic and atraumatic.  Eyes: Conjunctivae and EOM are normal. Pupils are equal, round, and reactive to light.  Neck: Normal range of motion. Neck supple.  Cardiovascular: Normal rate, regular rhythm and intact distal pulses.   Pulmonary/Chest: Effort normal.  Abdominal: Soft.  Musculoskeletal: He exhibits tenderness (Right arm in sugar tong splint.  NV intact.  Splint OK.  shoulder OK.  Left arm  negative.).  Neurological: He is alert and oriented to person, place, and time. He has normal reflexes. He displays normal reflexes. No cranial nerve deficit. He exhibits normal muscle tone. Coordination normal.  Skin: Skin is warm and dry.  Psychiatric: He has a normal mood and affect. His behavior is normal. Judgment and thought content normal.          Assessment & Plan:   Encounter Diagnoses  Name Primary?  . Closed nondisplaced fracture of head of right radius, initial encounter Yes  . Cigarette nicotine dependence without complication    Continue the splint.  Return in one week.  X-rays then out of the splint.  Call if any problem.  Precautions discussed.  Electronically Signed Darreld McleanWayne Jerelyn Trimarco,  MD 12/12/20178:39 AM

## 2016-09-12 ENCOUNTER — Ambulatory Visit (INDEPENDENT_AMBULATORY_CARE_PROVIDER_SITE_OTHER): Payer: Medicare Other

## 2016-09-12 ENCOUNTER — Ambulatory Visit (INDEPENDENT_AMBULATORY_CARE_PROVIDER_SITE_OTHER): Payer: Self-pay | Admitting: Orthopaedic Surgery

## 2016-09-12 DIAGNOSIS — S52124D Nondisplaced fracture of head of right radius, subsequent encounter for closed fracture with routine healing: Secondary | ICD-10-CM | POA: Diagnosis not present

## 2016-09-12 NOTE — Progress Notes (Signed)
CC:  My elbow does not hurt  He has done well in the splint over the last week. He has no pain of the right elbow.  ROM of the right elbow is good first time out of the splint.  NV intact.  Encounter Diagnosis  Name Primary?  . Closed nondisplaced fracture of head of right radius with routine healing, subsequent encounter Yes   Return in two weeks.  Use sling.  Limit any lifting.  Call if any problem.  X-rays on return.  Electronically Signed Darreld McleanWayne Jaila Schellhorn, MD 12/19/20179:52 AM

## 2016-09-26 ENCOUNTER — Encounter: Payer: Self-pay | Admitting: Orthopaedic Surgery

## 2016-09-26 ENCOUNTER — Encounter: Payer: Medicare Other | Admitting: Orthopaedic Surgery

## 2016-09-27 NOTE — Progress Notes (Signed)
This encounter was created in error - please disregard.

## 2016-10-03 ENCOUNTER — Ambulatory Visit (INDEPENDENT_AMBULATORY_CARE_PROVIDER_SITE_OTHER): Payer: Medicare Other

## 2016-10-03 ENCOUNTER — Ambulatory Visit (INDEPENDENT_AMBULATORY_CARE_PROVIDER_SITE_OTHER): Payer: Medicare Other | Admitting: Orthopaedic Surgery

## 2016-10-03 DIAGNOSIS — F17210 Nicotine dependence, cigarettes, uncomplicated: Secondary | ICD-10-CM

## 2016-10-03 DIAGNOSIS — S52124D Nondisplaced fracture of head of right radius, subsequent encounter for closed fracture with routine healing: Secondary | ICD-10-CM

## 2016-10-03 NOTE — Patient Instructions (Addendum)
Steps to Quit Smoking Smoking tobacco can be bad for your health. It can also affect almost every organ in your body. Smoking puts you and people around you at risk for many serious Aarish Rockers-lasting (chronic) diseases. Quitting smoking is hard, but it is one of the best things that you can do for your health. It is never too late to quit. What are the benefits of quitting smoking? When you quit smoking, you lower your risk for getting serious diseases and conditions. They can include:  Lung cancer or lung disease.  Heart disease.  Stroke.  Heart attack.  Not being able to have children (infertility).  Weak bones (osteoporosis) and broken bones (fractures). If you have coughing, wheezing, and shortness of breath, those symptoms may get better when you quit. You may also get sick less often. If you are pregnant, quitting smoking can help to lower your chances of having a baby of low birth weight. What can I do to help me quit smoking? Talk with your doctor about what can help you quit smoking. Some things you can do (strategies) include:  Quitting smoking totally, instead of slowly cutting back how much you smoke over a period of time.  Going to in-person counseling. You are more likely to quit if you go to many counseling sessions.  Using resources and support systems, such as:  Online chats with a counselor.  Phone quitlines.  Printed self-help materials.  Support groups or group counseling.  Text messaging programs.  Mobile phone apps or applications.  Taking medicines. Some of these medicines may have nicotine in them. If you are pregnant or breastfeeding, do not take any medicines to quit smoking unless your doctor says it is okay. Talk with your doctor about counseling or other things that can help you. Talk with your doctor about using more than one strategy at the same time, such as taking medicines while you are also going to in-person counseling. This can help make quitting  easier. What things can I do to make it easier to quit? Quitting smoking might feel very hard at first, but there is a lot that you can do to make it easier. Take these steps:  Talk to your family and friends. Ask them to support and encourage you.  Call phone quitlines, reach out to support groups, or work with a counselor.  Ask people who smoke to not smoke around you.  Avoid places that make you want (trigger) to smoke, such as:  Bars.  Parties.  Smoke-break areas at work.  Spend time with people who do not smoke.  Lower the stress in your life. Stress can make you want to smoke. Try these things to help your stress:  Getting regular exercise.  Deep-breathing exercises.  Yoga.  Meditating.  Doing a body scan. To do this, close your eyes, focus on one area of your body at a time from head to toe, and notice which parts of your body are tense. Try to relax the muscles in those areas.  Download or buy apps on your mobile phone or tablet that can help you stick to your quit plan. There are many free apps, such as QuitGuide from the CDC (Centers for Disease Control and Prevention). You can find more support from smokefree.gov and other websites. This information is not intended to replace advice given to you by your health care provider. Make sure you discuss any questions you have with your health care provider. Document Released: 07/08/2009 Document Revised: 05/09/2016 Document   Reviewed: 01/26/2015 Elsevier Interactive Patient Education  2017 Elsevier Inc.  Steps to Quit Smoking Smoking tobacco can be bad for your health. It can also affect almost every organ in your body. Smoking puts you and people around you at risk for many serious Barry Culverhouse-lasting (chronic) diseases. Quitting smoking is hard, but it is one of the best things that you can do for your health. It is never too late to quit. What are the benefits of quitting smoking? When you quit smoking, you lower your risk for  getting serious diseases and conditions. They can include:  Lung cancer or lung disease.  Heart disease.  Stroke.  Heart attack.  Not being able to have children (infertility).  Weak bones (osteoporosis) and broken bones (fractures). If you have coughing, wheezing, and shortness of breath, those symptoms may get better when you quit. You may also get sick less often. If you are pregnant, quitting smoking can help to lower your chances of having a baby of low birth weight. What can I do to help me quit smoking? Talk with your doctor about what can help you quit smoking. Some things you can do (strategies) include:  Quitting smoking totally, instead of slowly cutting back how much you smoke over a period of time.  Going to in-person counseling. You are more likely to quit if you go to many counseling sessions.  Using resources and support systems, such as:  Online chats with a counselor.  Phone quitlines.  Printed self-help materials.  Support groups or group counseling.  Text messaging programs.  Mobile phone apps or applications.  Taking medicines. Some of these medicines may have nicotine in them. If you are pregnant or breastfeeding, do not take any medicines to quit smoking unless your doctor says it is okay. Talk with your doctor about counseling or other things that can help you. Talk with your doctor about using more than one strategy at the same time, such as taking medicines while you are also going to in-person counseling. This can help make quitting easier. What things can I do to make it easier to quit? Quitting smoking might feel very hard at first, but there is a lot that you can do to make it easier. Take these steps:  Talk to your family and friends. Ask them to support and encourage you.  Call phone quitlines, reach out to support groups, or work with a counselor.  Ask people who smoke to not smoke around you.  Avoid places that make you want (trigger) to  smoke, such as:  Bars.  Parties.  Smoke-break areas at work.  Spend time with people who do not smoke.  Lower the stress in your life. Stress can make you want to smoke. Try these things to help your stress:  Getting regular exercise.  Deep-breathing exercises.  Yoga.  Meditating.  Doing a body scan. To do this, close your eyes, focus on one area of your body at a time from head to toe, and notice which parts of your body are tense. Try to relax the muscles in those areas.  Download or buy apps on your mobile phone or tablet that can help you stick to your quit plan. There are many free apps, such as QuitGuide from the CDC (Centers for Disease Control and Prevention). You can find more support from smokefree.gov and other websites. This information is not intended to replace advice given to you by your health care provider. Make sure you discuss any questions you have   with your health care provider. Document Released: 07/08/2009 Document Revised: 05/09/2016 Document Reviewed: 01/26/2015 Elsevier Interactive Patient Education  2017 Elsevier Inc.  

## 2016-10-03 NOTE — Progress Notes (Signed)
CC:  My elbow is better  He has full ROM of the right elbow and no pain.  NV intact. He is still smoking.  Encounter Diagnoses  Name Primary?  . Closed nondisplaced fracture of head of right radius with routine healing, subsequent encounter Yes  . Cigarette nicotine dependence without complication    Discharge.  Electronically Signed Darreld McleanWayne Sadia Belfiore, MD 1/9/20183:07 PM

## 2018-08-24 ENCOUNTER — Emergency Department (HOSPITAL_COMMUNITY)
Admission: EM | Admit: 2018-08-24 | Discharge: 2018-08-24 | Disposition: A | Discharge status: Home / Self Care | Payer: Medicare Other | Attending: Emergency Medicine | Admitting: Emergency Medicine

## 2018-08-24 ENCOUNTER — Other Ambulatory Visit: Payer: Self-pay

## 2018-08-24 ENCOUNTER — Encounter (HOSPITAL_COMMUNITY): Payer: Self-pay | Admitting: Emergency Medicine

## 2018-08-24 DIAGNOSIS — Z79899 Other long term (current) drug therapy: Secondary | ICD-10-CM | POA: Insufficient documentation

## 2018-08-24 DIAGNOSIS — F79 Unspecified intellectual disabilities: Secondary | ICD-10-CM | POA: Diagnosis not present

## 2018-08-24 DIAGNOSIS — F17210 Nicotine dependence, cigarettes, uncomplicated: Secondary | ICD-10-CM | POA: Diagnosis not present

## 2018-08-24 DIAGNOSIS — J45909 Unspecified asthma, uncomplicated: Secondary | ICD-10-CM | POA: Diagnosis not present

## 2018-08-24 DIAGNOSIS — K625 Hemorrhage of anus and rectum: Secondary | ICD-10-CM | POA: Diagnosis present

## 2018-08-24 DIAGNOSIS — K649 Unspecified hemorrhoids: Secondary | ICD-10-CM

## 2018-08-24 HISTORY — DX: Benign prostatic hyperplasia without lower urinary tract symptoms: N40.0

## 2018-08-24 LAB — BASIC METABOLIC PANEL
ANION GAP: 6 (ref 5–15)
BUN: 9 mg/dL (ref 6–20)
CHLORIDE: 106 mmol/L (ref 98–111)
CO2: 24 mmol/L (ref 22–32)
Calcium: 8.9 mg/dL (ref 8.9–10.3)
Creatinine, Ser: 0.78 mg/dL (ref 0.61–1.24)
GFR calc non Af Amer: >60 mL/min (ref >60)
GLUCOSE: 101 mg/dL — AB (ref 70–99)
Potassium: 3.9 mmol/L (ref 3.5–5.1)
Sodium: 136 mmol/L (ref 135–145)

## 2018-08-24 LAB — CBC
HEMATOCRIT: 47 % (ref 39.0–52.0)
Hemoglobin: 15.2 g/dL (ref 13.0–17.0)
MCH: 30.1 pg (ref 26.0–34.0)
MCHC: 32.3 g/dL (ref 30.0–36.0)
MCV: 93.1 fL (ref 80.0–100.0)
NRBC: 0 % (ref 0.0–0.2)
Platelets: 181 10*3/uL (ref 150–400)
RBC: 5.05 MIL/uL (ref 4.22–5.81)
RDW: 12.7 % (ref 11.5–15.5)
WBC: 13.6 10*3/uL — AB (ref 4.0–10.5)

## 2018-08-24 LAB — POC OCCULT BLOOD, ED: Fecal Occult Bld: POSITIVE — AB

## 2018-08-24 MED ORDER — HYDROCORTISONE 2.5 % RE CREA: TOPICAL_CREAM | RECTAL | 0 refills | Status: AC

## 2018-08-24 NOTE — ED Notes (Signed)
[facility] in [place] called to pick pt up for discharge. No answer.

## 2018-08-24 NOTE — ED Notes (Signed)
Spoke with Mrs. [facility] at [facility] states she is working on a ride. ETA to pick up pt is within the next hour.

## 2018-08-24 NOTE — ED Notes (Signed)
Pt is having rectal pain and bleeding. Stain to the back of pants. Is in NAD, but intense pain. States he has a lot of hemorrhoids

## 2018-08-24 NOTE — ED Triage Notes (Signed)
[facility]. Patient alert and oriented. Patient brought in for rectal pain and rectal bleeding. Per staff patient has had staining to clothes for past week. Patient reports changing multiple times due to rectal bleeding. Per patient blood is bright red.

## 2018-08-24 NOTE — ED Notes (Signed)
DC instructions to Mrs [name]  She understands DC instruction, med regime and follow up

## 2018-08-24 NOTE — ED Provider Notes (Addendum)
[name] Provider Note   CSN: [id number] Arrival date & time: 08/24/18  0930     History   Chief Complaint Chief Complaint  Patient presents with  . Rectal Pain  . Rectal Bleeding    HPI Chad BlendJeffrey T Stout is a 47 y.o. male presenting from facility for 2-day history of small amount bright red blood with bowel movements and rectal pain.  Patient states that his pain started suddenly after having a bowel movement 2 days ago and have persisted since that time.  Constant, moderate, sharp/aching pain worse with bowel movement.  Patient states that aside from rectal pain and small amount of bright red blood in the stool that he is feeling well today and is without other complaints.  Patient states that he has had rectal pain and scant bleeding intermittently in the past for many years however has not sought evaluation for these problems.  HPI  Past Medical History:  Diagnosis Date  . Alcohol dependence (HCC)   . Asthma   . COPD (chronic obstructive pulmonary disease) (HCC)   . Mental retardation   . Prostate enlargement   . Seizures (HCC)     There are no active problems to display for this patient.   Past Surgical History:  Procedure Laterality Date  . CYSTOSCOPY WITH BIOPSY  08/15/2011   Procedure: CYSTOSCOPY WITH BIOPSY;  Surgeon: Ky BarbanMohammad I Javaid;  Location: [facility];  Service: Urology;  Laterality: N/A;  Cystoscopy/Cystogram with Bladder Bx  . TRANSURETHRAL RESECTION OF PROSTATE          Home Medications    Prior to Admission medications   Medication Sig Start Date End Date Taking? Authorizing Provider  Ernestina PatchesANORO ELLIPTA 62.5-25 MCG/INH AEPB  08/08/18  Yes [provider]  Cholecalciferol (VITAMIN D) 50 MCG (2000 UT) CAPS Take 1 capsule by mouth daily.   Yes [provider]  hydrOXYzine (VISTARIL) 25 MG capsule Take 25 mg by mouth at bedtime.  08/08/18  Yes [provider]  lovastatin (MEVACOR) 10 MG tablet  08/14/18   Yes [provider]  OLANZapine (ZYPREXA) 2.5 MG tablet Take 2.5 mg by mouth daily.  08/08/18  Yes [provider]  albuterol (PROVENTIL HFA;VENTOLIN HFA) 108 (90 BASE) MCG/ACT inhaler Inhale 2 puffs into the lungs every 4 (four) hours as needed for wheezing or shortness of breath.     [provider]  hydrocortisone (ANUSOL-HC) 2.5 % rectal cream Apply rectally 2 times daily 08/24/18   Bill SalinasMorelli, Huberta Tompkins A, PA-C    Family History No family history on file.  Social History Social History   Tobacco Use  . Smoking status: Current Every Day Smoker    Packs/day: 2.00    Years: 15.00    Pack years: 30.00  . Smokeless tobacco: Current User    Types: Chew  Substance Use Topics  . Alcohol use: No  . Drug use: No     Allergies   Patient has no known allergies.   Review of Systems Review of Systems  Constitutional: Negative.  Negative for chills and fever.  HENT: Negative.  Negative for rhinorrhea and sore throat.   Eyes: Negative.  Negative for visual disturbance.  Respiratory: Negative.  Negative for cough and shortness of breath.   Cardiovascular: Negative.  Negative for chest pain.  Gastrointestinal: Positive for anal bleeding (rectal pain). Negative for abdominal pain, constipation, diarrhea, nausea and vomiting.  Genitourinary: Negative.  Negative for dysuria and hematuria.  Musculoskeletal: Negative.  Negative for arthralgias  and myalgias.  Skin: Negative.  Negative for rash.  Neurological: Negative.  Negative for dizziness, weakness, light-headedness and headaches.   Physical Exam Updated Vital Signs BP 124/80   Pulse 75   Temp 98.1 F (36.7 C) (Oral)   Resp 12   Ht 5\' 6"  (1.676 m)   Wt 66.7 kg   SpO2 95%   BMI 23.73 kg/m   Physical Exam  Constitutional: He appears well-developed and well-nourished. No distress.  HENT:  Head: Normocephalic and atraumatic.  Right Ear: External ear normal.  Left Ear: External ear normal.  Nose: Nose  normal.  Eyes: Pupils are equal, round, and reactive to light. EOM are normal.  Neck: Trachea normal and normal range of motion. No tracheal deviation present.  Cardiovascular: Normal rate, regular rhythm, normal heart sounds and intact distal pulses.  Pulses:      Dorsalis pedis pulses are 2+ on the right side, and 2+ on the left side.       Posterior tibial pulses are 2+ on the right side, and 2+ on the left side.  Pulmonary/Chest: Effort normal. No respiratory distress.  Abdominal: Soft. There is no tenderness. There is no rebound and no guarding.  Genitourinary:  Genitourinary Comments: Chaperone was present Tori NT.    Multiple external hemorrhoids present. Do not appear thrombosed. No obvious bleeding at this time. Patient with pain around the rectal area. Perianal sensory intact. No external fissure's palpated on examined. No induration of the skin or swelling. Digital Rectal Exam reveals sphincter with good tone. No masses palpated. Stool color is brown with no overt blood or melena.   Musculoskeletal: Normal range of motion.       Right lower leg: Normal.       Left lower leg: Normal.  Patient moving all extremities spontaneously without signs of distress.  He is able to roll himself side to side without difficulty.  Feet:  Right Foot:  Protective Sensation: 3 sites tested. 3 sites sensed.  Left Foot:  Protective Sensation: 3 sites tested. 3 sites sensed.  Neurological: He is alert. GCS eye subscore is 4. GCS verbal subscore is 5. GCS motor subscore is 6.  Speech is clear and goal oriented, follows commands Major Cranial nerves without deficit, no facial droop Normal strength in upper and lower extremities bilaterally including dorsiflexion and plantar flexion, strong and equal grip strength Sensation normal to light touch Moves extremities without ataxia, coordination intact  Skin: Skin is warm and dry. Capillary refill takes less than 2 seconds.  Psychiatric: He has a normal  mood and affect. His behavior is normal.   ED Treatments / Results  Labs (all labs ordered are listed, but only abnormal results are displayed) Labs Reviewed  CBC - Abnormal; Notable for the following components:      Result Value   WBC 13.6 (*)    All other components within normal limits  BASIC METABOLIC PANEL - Abnormal; Notable for the following components:   Glucose, Bld 101 (*)    All other components within normal limits  POC OCCULT BLOOD, ED - Abnormal; Notable for the following components:   Fecal Occult Bld POSITIVE (*)    All other components within normal limits    EKG None  Radiology No results found.  Procedures Procedures (including critical care time)  Medications Ordered in ED Medications - No data to display   Initial Impression / Assessment and Plan / ED Course  I have reviewed the triage vital signs and the  nursing notes.  Pertinent labs & imaging results that were available during my care of the patient were reviewed by me and considered in my medical decision making (see chart for details).    47 year old male presenting from nursing home for complaint of 2 days of small amount of bright red blood per rectum and rectal pain with bowel movements.  Patient has had this problem intermittently over the past few years however has not been evaluated for it prior to this visit.  Examination today reveals large hemorrhoids, nonthrombosed without obvious bleeding.  Positive Hemoccult test today.  Patient's CBC is with mild leukocytosis however with out systemic symptoms or other complaints.  Do not suspect infection at this time.  Additionally patient's hemoglobin is 15.2, no signs of anemia.  BMP is unremarkable.  Plan at this time is to treat symptomatically, Anusol cream and encourage sitz bath's.  Referral given to general surgery, Dr. Lovell Sheehan for further evaluation and treatment of patient's hemorrhoids.  Patient states understanding and was agreeable to care  plan.  Patient is without other complaints today.  Afebrile, not tachycardic, not hypotensive, well-appearing and in no acute distress.  Patient does not meet sepsis/SIRS criteria.  At this time there does not appear to be any evidence of an acute emergency medical condition and the patient appears stable for discharge with appropriate outpatient follow up. Diagnosis was discussed with patient who verbalizes understanding of care plan and is agreeable to discharge. I have discussed return precautions with patient  who verbalizes understanding of return precautions. Patient strongly encouraged to follow-up with their PCP within next week and General Surgery next week. All questions answered.  Patient's case discussed with Dr. Estell Harpin who agrees with plan to discharge with Anusol and General surgery follow-up.   Note: Portions of this report may have been transcribed using voice recognition software. Every effort was made to ensure accuracy; however, inadvertent computerized transcription errors may still be present. Final Clinical Impressions(s) / ED Diagnoses   Final diagnoses:  Hemorrhoids, unspecified hemorrhoid type    ED Discharge Orders         Ordered    hydrocortisone (ANUSOL-HC) 2.5 % rectal cream     08/24/18 1213           Bill Salinas, PA-C 08/24/18 1229    Bill Salinas, PA-C 08/24/18 1230    Bethann Berkshire, MD 08/25/18 0710

## 2018-08-24 NOTE — Discharge Instructions (Addendum)
You have been diagnosed today with hemorrhoids.  At this time there does not appear to be the presence of an emergent medical condition, however there is always the potential for conditions to change. Please read and follow the below instructions.  Please return to the Emergency Department immediately for any new or worsening symptoms. Please be sure to follow up with your Primary Care Provider next week regarding your visit today; please call their office to schedule an appointment even if you are feeling better for a follow-up visit. Please see the general surgeon Dr. Lovell SheehanJenkins next week for further evaluation and treatment of your hemorrhoids.  You may use the Anusol cream prescribed today to help with your symptoms.  You may also use sitz bath's to help with your symptoms.  Contact a health care provider if: You have increasing pain and swelling that are not controlled by treatment or medicine. You have uncontrolled bleeding. You have difficulty having a bowel movement, or you are unable to have a bowel movement. You have pain or inflammation outside the area of the hemorrhoids. You have Fever or any other concerning symptoms  Please read the additional information packets attached to your discharge summary.  Do not take your medicine if  develop an itchy rash, swelling in your mouth or lips, or difficulty breathing.

## 2018-08-24 NOTE — ED Notes (Signed)
Pulse ox repositioned on finger with pulse ox of 98 per cent on RA

## 2018-08-24 NOTE — ED Notes (Signed)
Continues to await transport home

## 2018-08-24 NOTE — ED Notes (Signed)
Report to [facility] will come for pt

## 2018-08-24 NOTE — ED Notes (Signed)
Call to [facility] - pt is to be discharged

## 2018-10-22 ENCOUNTER — Ambulatory Visit (INDEPENDENT_AMBULATORY_CARE_PROVIDER_SITE_OTHER): Payer: Medicare Other | Admitting: General Surgery

## 2018-10-22 ENCOUNTER — Encounter: Payer: Self-pay | Admitting: General Surgery

## 2018-10-22 VITALS — BP 151/73 | HR 98 | Temp 99.1°F | Resp 18 | Wt 160.0 lb

## 2018-10-22 DIAGNOSIS — K649 Unspecified hemorrhoids: Secondary | ICD-10-CM | POA: Diagnosis not present

## 2018-10-22 NOTE — Progress Notes (Signed)
[name]; [id number]; [birth year]   HPI Patient is a 48 year old white male who was referred to my care by Dr. Selena Batten for evaluation treatment of bleeding hemorrhoidal disease.  History is somewhat limited as the patient suffers from mental retardation.  He lives in a group home.  He was seen in the emergency room in November 2019 and diagnosed with external hemorrhoidal bleeding.  He states his last episode of rectal bleeding was 2 weeks ago.  He denies any rectal pain, abdominal pain, constipation, or diarrhea.  He states he goes to the bathroom several times a week.  He currently has 0 out of 10 pain. Past Medical History:  Diagnosis Date  . Alcohol dependence (HCC)   . Asthma   . COPD (chronic obstructive pulmonary disease) (HCC)   . Mental retardation   . Prostate enlargement   . Seizures (HCC)     Past Surgical History:  Procedure Laterality Date  . CYSTOSCOPY WITH BIOPSY  08/15/2011   Procedure: CYSTOSCOPY WITH BIOPSY;  Surgeon: Ky Barban;  Location: [facility];  Service: Urology;  Laterality: N/A;  Cystoscopy/Cystogram with Bladder Bx  . TRANSURETHRAL RESECTION OF PROSTATE      History reviewed. No pertinent family history.  Current Outpatient Medications on File Prior to Visit  Medication Sig Dispense Refill  . acetaminophen (TYLENOL) 500 MG tablet Take 500 mg by mouth every 6 (six) hours as needed.    Marland Kitchen albuterol (PROVENTIL HFA;VENTOLIN HFA) 108 (90 BASE) MCG/ACT inhaler Inhale 2 puffs into the lungs every 4 (four) hours as needed for wheezing or shortness of breath.     . Diclofenac-miSOPROStol 75-0.2 MG TBEC Take by mouth.    . hydrocortisone (ANUSOL-HC) 2.5 % rectal cream Apply rectally 2 times daily 28.35 g 0   No current facility-administered medications on file prior to visit.     No Known Allergies  Social History   Substance and Sexual Activity  Alcohol Use No    Social History   Tobacco Use  Smoking Status Current Every Day Smoker  . Packs/day:  2.00  . Years: 15.00  . Pack years: 30.00  Smokeless Tobacco Current User  . Types: Chew    Review of Systems  Unable to perform ROS: Mental acuity    Objective   Vitals:   10/22/18 1243  BP: (!) 151/73  Pulse: 98  Resp: 18  Temp: 99.1 F (37.3 C)    Physical Exam Vitals signs reviewed.  Constitutional:      Appearance: Normal appearance. He is normal weight. He is not ill-appearing.  HENT:     Head: Normocephalic and atraumatic.  Cardiovascular:     Rate and Rhythm: Normal rate and regular rhythm.     Heart sounds: Normal heart sounds. No murmur. No friction rub. No gallop.   Pulmonary:     Effort: Pulmonary effort is normal. No respiratory distress.     Breath sounds: Normal breath sounds. No stridor. No wheezing, rhonchi or rales.  Abdominal:     General: Abdomen is flat. Bowel sounds are normal. There is no distension.     Palpations: Abdomen is soft. There is no mass.     Tenderness: There is no abdominal tenderness. There is no guarding.     Hernia: No hernia is present.  Genitourinary:    Comments: Rectal examination reveals normal sphincter tone.  Minimal external hemorrhoidal disease noted.  No significant internal hemorrhoidal disease appreciated.  No anal fissure present.  No blood noted. Skin:  General: Skin is warm and dry.  Neurological:     Mental Status: He is alert and oriented to person, place, and time.   ER notes reviewed  Assessment  History of hemorrhoidal bleeding, not currently active Plan   I told patient and the caregiver that surgery was not indicated at this time.  They understand and agree.  Should he develop bleeding again, he should call my office.  He should avoid constipation and straining.

## 2018-10-22 NOTE — Patient Instructions (Signed)
Hemorrhoids Hemorrhoids are swollen veins that may develop:  In the butt (rectum). These are called internal hemorrhoids.  Around the opening of the butt (anus). These are called external hemorrhoids. Hemorrhoids can cause pain, itching, or bleeding. Most of the time, they do not cause serious problems. They usually get better with diet changes, lifestyle changes, and other home treatments. What are the causes? This condition may be caused by:  Having trouble pooping (constipation).  Pushing hard (straining) to poop.  Watery poop (diarrhea).  Pregnancy.  Being very overweight (obese).  Sitting for long periods of time.  Heavy lifting or other activity that causes you to strain.  Anal sex.  Riding a bike for a long period of time. What are the signs or symptoms? Symptoms of this condition include:  Pain.  Itching or soreness in the butt.  Bleeding from the butt.  Leaking poop.  Swelling in the area.  One or more lumps around the opening of your butt. How is this diagnosed? A doctor can often diagnose this condition by looking at the affected area. The doctor may also:  Do an exam that involves feeling the area with a gloved hand (digital rectal exam).  Examine the area inside your butt using a small tube (anoscope).  Order blood tests. This may be done if you have lost a lot of blood.  Have you get a test that involves looking inside the colon using a flexible tube with a camera on the end (sigmoidoscopy or colonoscopy). How is this treated? This condition can usually be treated at home. Your doctor may tell you to change what you eat, make lifestyle changes, or try home treatments. If these do not help, procedures can be done to remove the hemorrhoids or make them smaller. These may involve:  Placing rubber bands at the base of the hemorrhoids to cut off their blood supply.  Injecting medicine into the hemorrhoids to shrink them.  Shining a type of light  energy onto the hemorrhoids to cause them to fall off.  Doing surgery to remove the hemorrhoids or cut off their blood supply. Follow these instructions at home: Eating and drinking   Eat foods that have a lot of fiber in them. These include whole grains, beans, nuts, fruits, and vegetables.  Ask your doctor about taking products that have added fiber (fibersupplements).  Reduce the amount of fat in your diet. You can do this by: ? Eating low-fat dairy products. ? Eating less red meat. ? Avoiding processed foods.  Drink enough fluid to keep your pee (urine) pale yellow. Managing pain and swelling   Take a warm-water bath (sitz bath) for 20 minutes to ease pain. Do this 3-4 times a day. You may do this in a bathtub or using a portable sitz bath that fits over the toilet.  If told, put ice on the painful area. It may be helpful to use ice between your warm baths. ? Put ice in a plastic bag. ? Place a towel between your skin and the bag. ? Leave the ice on for 20 minutes, 2-3 times a day. General instructions  Take over-the-counter and prescription medicines only as told by your doctor. ? Medicated creams and medicines may be used as told.  Exercise often. Ask your doctor how much and what kind of exercise is best for you.  Go to the bathroom when you have the urge to poop. Do not wait.  Avoid pushing too hard when you poop.  Keep your   butt dry and clean. Use wet toilet paper or moist towelettes after pooping.  Do not sit on the toilet for a long time.  Keep all follow-up visits as told by your doctor. This is important. Contact a doctor if you:  Have pain and swelling that do not get better with treatment or medicine.  Have trouble pooping.  Cannot poop.  Have pain or swelling outside the area of the hemorrhoids. Get help right away if you have:  Bleeding that will not stop. Summary  Hemorrhoids are swollen veins in the butt or around the opening of the butt.   They can cause pain, itching, or bleeding.  Eat foods that have a lot of fiber in them. These include whole grains, beans, nuts, fruits, and vegetables.  Take a warm-water bath (sitz bath) for 20 minutes to ease pain. Do this 3-4 times a day. This information is not intended to replace advice given to you by your health care provider. Make sure you discuss any questions you have with your health care provider. Document Released: 06/20/2008 Document Revised: 01/31/2018 Document Reviewed: 01/31/2018 Elsevier Interactive Patient Education  2019 [employer]  

## 2018-10-23 IMAGING — DX DG HUMERUS 2V *R*
2 series · 2 of 2 positions shown · non-contrast
Comparison: Elbow films same date dictated separately.

CLINICAL DATA: 45-year-old male fell yesterday on right arm with
pain. Initial encounter.

EXAM:
RIGHT HUMERUS - 2+ VIEW

[humerus ap]
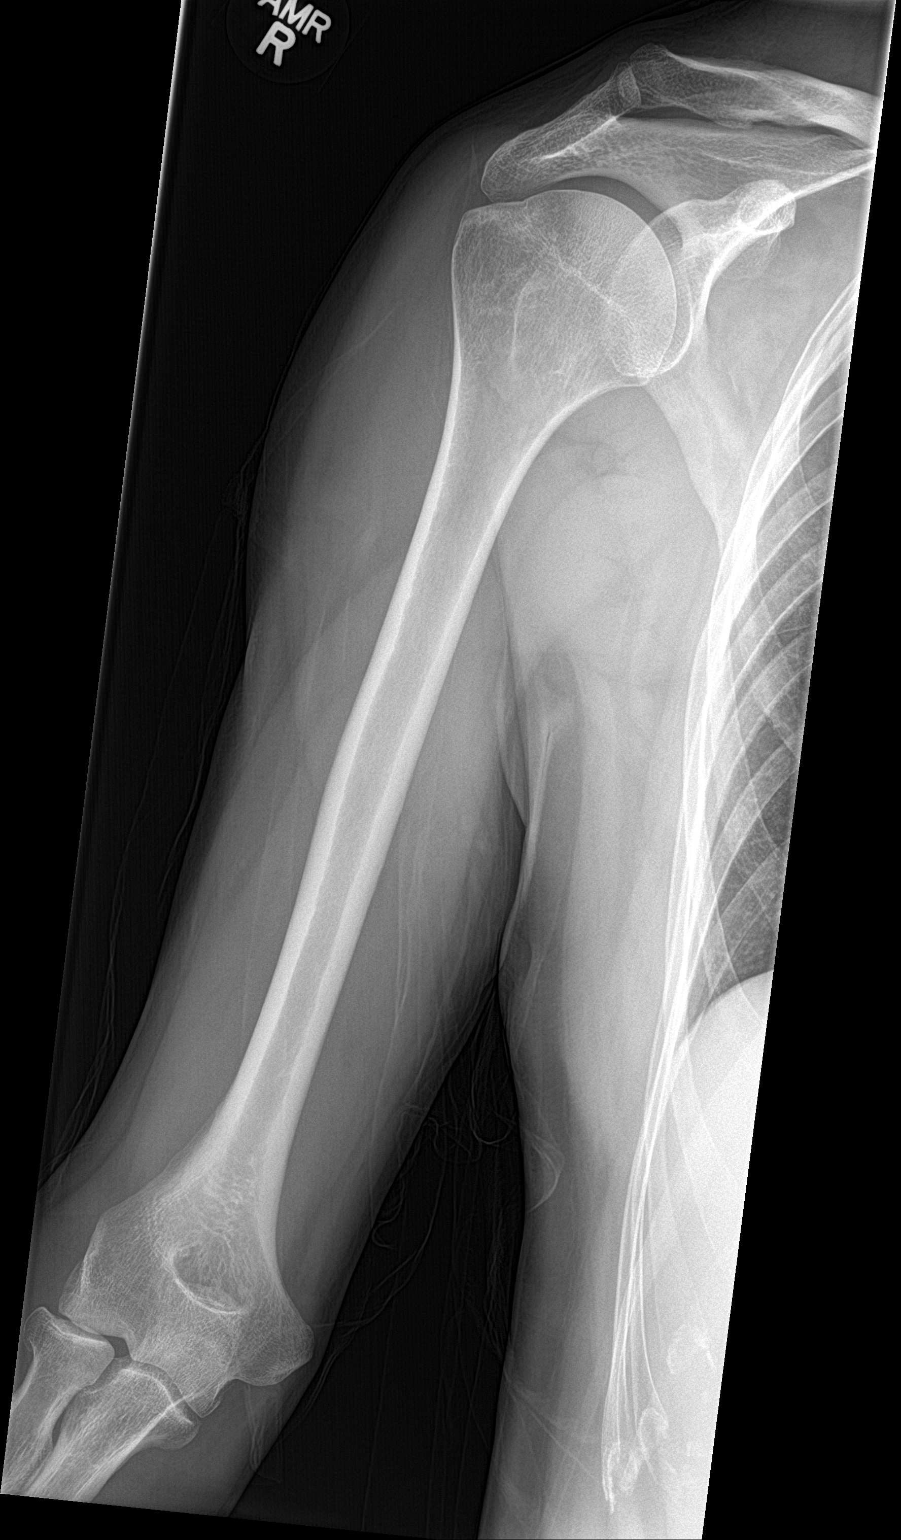

[humerus lat]
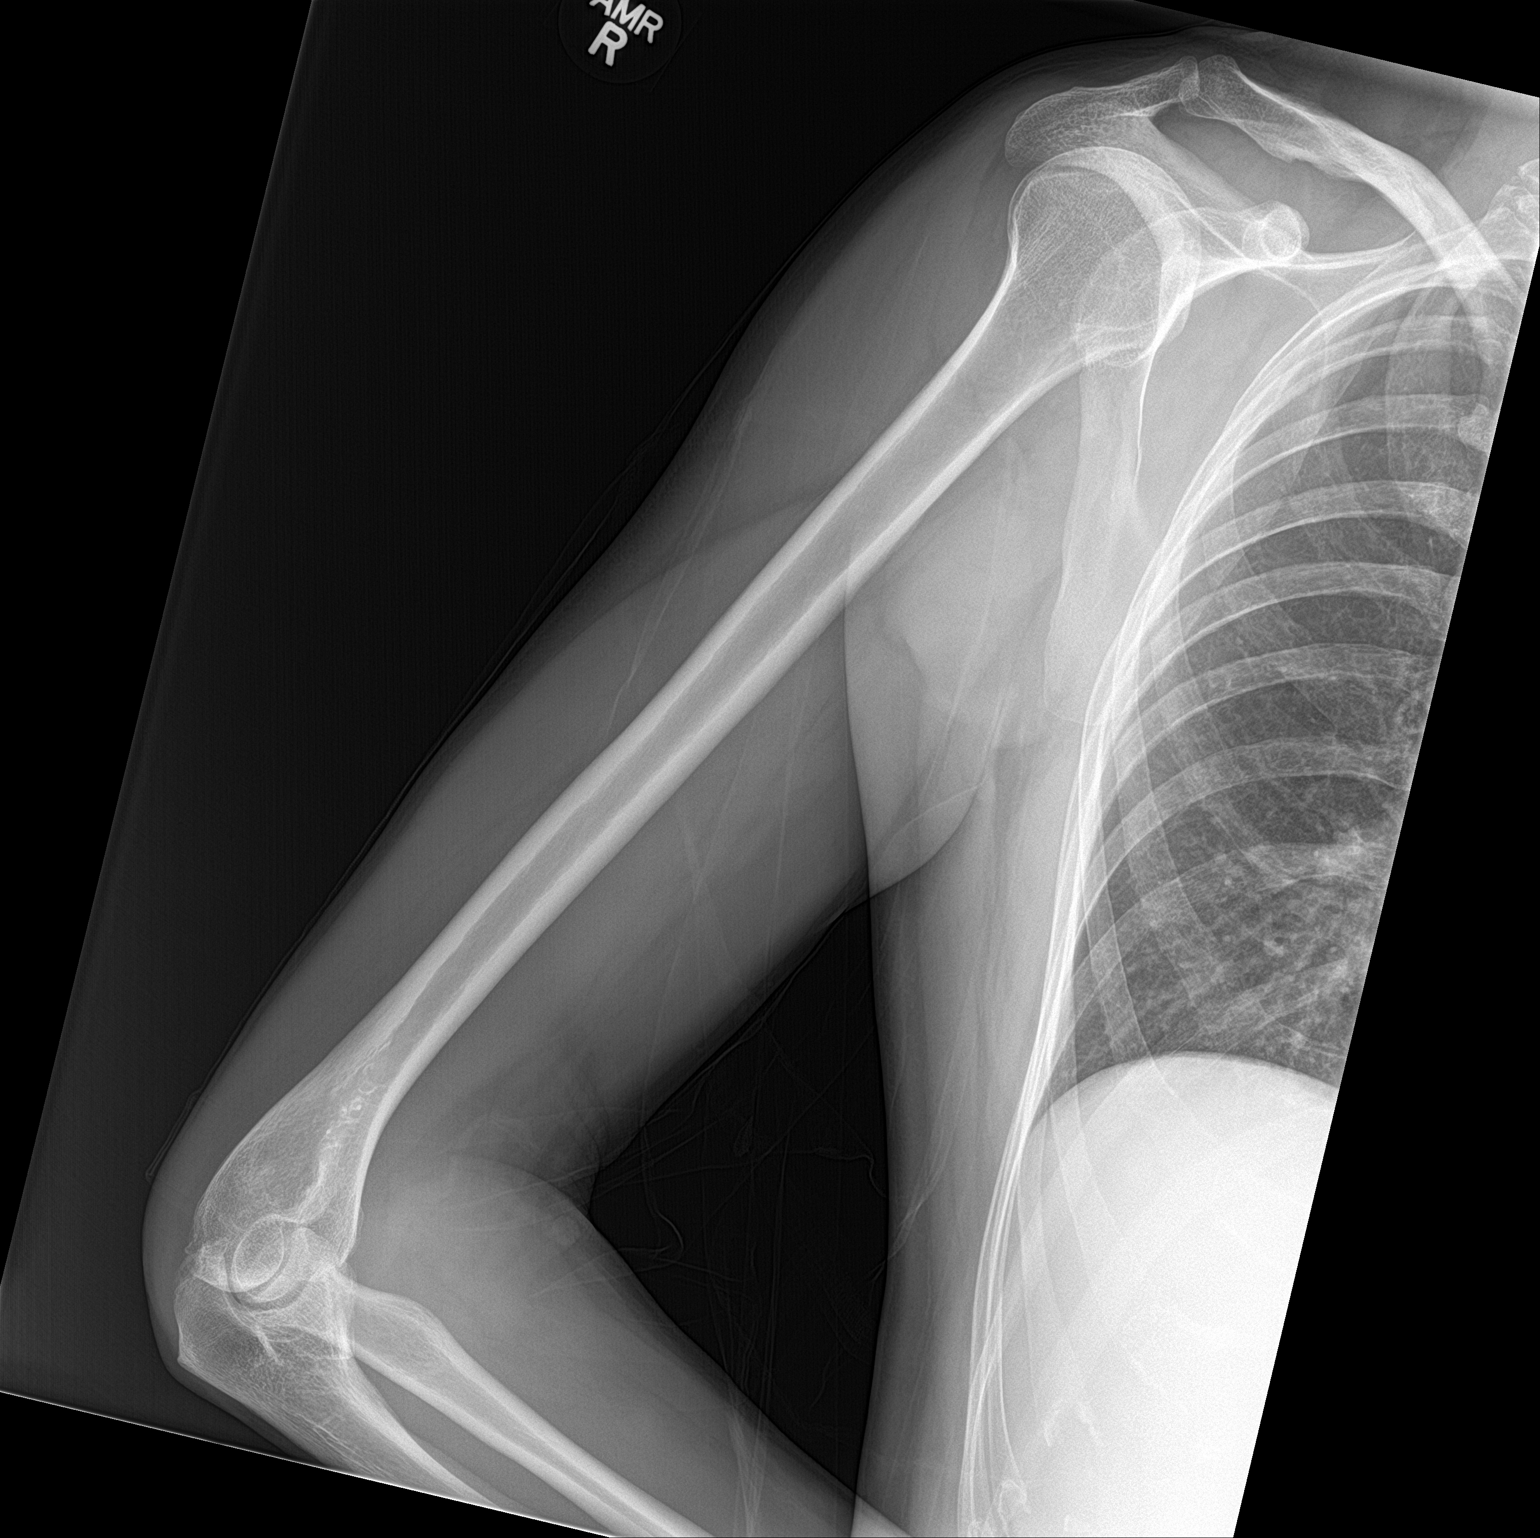

[2 of 2 positions shown; findings below may reference images not displayed]

FINDINGS: No right humerus fracture.

Abnormal appearance of the right radial head. Please see elbow
report performed same date and dictated separately.

Visualized lungs clear.
IMPRESSION: No right humerus fracture.

Abnormal appearance of the right radial head. Please see elbow
report performed same date and dictated separately.

## 2019-10-06 DIAGNOSIS — F319 Bipolar disorder, unspecified: Secondary | ICD-10-CM | POA: Diagnosis not present

## 2019-12-01 ENCOUNTER — Other Ambulatory Visit: Payer: Self-pay

## 2019-12-01 ENCOUNTER — Ambulatory Visit: Payer: Medicare HMO | Attending: Internal Medicine

## 2019-12-01 DIAGNOSIS — Z20822 Contact with and (suspected) exposure to covid-19: Secondary | ICD-10-CM

## 2019-12-02 LAB — NOVEL CORONAVIRUS, NAA: SARS-CoV-2, NAA: NOT DETECTED

## 2019-12-23 DIAGNOSIS — E785 Hyperlipidemia, unspecified: Secondary | ICD-10-CM | POA: Diagnosis not present

## 2019-12-23 DIAGNOSIS — R7303 Prediabetes: Secondary | ICD-10-CM | POA: Diagnosis not present

## 2020-01-01 DIAGNOSIS — R7303 Prediabetes: Secondary | ICD-10-CM | POA: Diagnosis not present

## 2020-01-01 DIAGNOSIS — F17210 Nicotine dependence, cigarettes, uncomplicated: Secondary | ICD-10-CM | POA: Diagnosis not present

## 2020-01-01 DIAGNOSIS — E785 Hyperlipidemia, unspecified: Secondary | ICD-10-CM | POA: Diagnosis not present

## 2020-01-01 DIAGNOSIS — F1021 Alcohol dependence, in remission: Secondary | ICD-10-CM | POA: Diagnosis not present

## 2020-01-01 DIAGNOSIS — J449 Chronic obstructive pulmonary disease, unspecified: Secondary | ICD-10-CM | POA: Diagnosis not present

## 2020-01-01 DIAGNOSIS — F259 Schizoaffective disorder, unspecified: Secondary | ICD-10-CM | POA: Diagnosis not present

## 2020-01-16 ENCOUNTER — Encounter (HOSPITAL_COMMUNITY): Payer: Self-pay | Admitting: *Deleted

## 2020-01-16 ENCOUNTER — Emergency Department (HOSPITAL_COMMUNITY)
Admission: EM | Admit: 2020-01-16 | Discharge: 2020-01-16 | Disposition: A | Discharge status: Home / Self Care | Payer: Medicare HMO | Attending: Emergency Medicine | Admitting: Emergency Medicine

## 2020-01-16 ENCOUNTER — Other Ambulatory Visit: Payer: Self-pay

## 2020-01-16 ENCOUNTER — Emergency Department (HOSPITAL_COMMUNITY): Payer: Medicare HMO

## 2020-01-16 DIAGNOSIS — W450XXA Nail entering through skin, initial encounter: Secondary | ICD-10-CM | POA: Insufficient documentation

## 2020-01-16 DIAGNOSIS — Y990 Civilian activity done for income or pay: Secondary | ICD-10-CM | POA: Diagnosis not present

## 2020-01-16 DIAGNOSIS — S99922A Unspecified injury of left foot, initial encounter: Secondary | ICD-10-CM | POA: Diagnosis not present

## 2020-01-16 DIAGNOSIS — Y9301 Activity, walking, marching and hiking: Secondary | ICD-10-CM | POA: Diagnosis not present

## 2020-01-16 DIAGNOSIS — R609 Edema, unspecified: Secondary | ICD-10-CM | POA: Diagnosis not present

## 2020-01-16 DIAGNOSIS — Z23 Encounter for immunization: Secondary | ICD-10-CM | POA: Diagnosis not present

## 2020-01-16 DIAGNOSIS — F17210 Nicotine dependence, cigarettes, uncomplicated: Secondary | ICD-10-CM | POA: Diagnosis not present

## 2020-01-16 DIAGNOSIS — J449 Chronic obstructive pulmonary disease, unspecified: Secondary | ICD-10-CM | POA: Diagnosis not present

## 2020-01-16 DIAGNOSIS — Z79899 Other long term (current) drug therapy: Secondary | ICD-10-CM | POA: Insufficient documentation

## 2020-01-16 DIAGNOSIS — Y9259 Other trade areas as the place of occurrence of the external cause: Secondary | ICD-10-CM | POA: Diagnosis not present

## 2020-01-16 DIAGNOSIS — S91332A Puncture wound without foreign body, left foot, initial encounter: Secondary | ICD-10-CM | POA: Diagnosis not present

## 2020-01-16 DIAGNOSIS — T148XXA Other injury of unspecified body region, initial encounter: Secondary | ICD-10-CM

## 2020-01-16 DIAGNOSIS — R52 Pain, unspecified: Secondary | ICD-10-CM | POA: Diagnosis not present

## 2020-01-16 DIAGNOSIS — M7989 Other specified soft tissue disorders: Secondary | ICD-10-CM | POA: Diagnosis not present

## 2020-01-16 MED ORDER — CIPROFLOXACIN HCL 500 MG PO TABS: 500.0000 mg | ORAL_TABLET | Freq: Two times a day (BID) | ORAL | 0 refills | Status: AC

## 2020-01-16 MED ORDER — TETANUS-DIPHTH-ACELL PERTUSSIS 5-2.5-18.5 LF-MCG/0.5 IM SUSP
0.5000 mL | Freq: Once | INTRAMUSCULAR | Status: AC
  Administered 2020-01-16: 17:00:00 0.5 mL via INTRAMUSCULAR
  Filled 2020-01-16: qty 0.5

## 2020-01-16 NOTE — ED Provider Notes (Signed)
49 y/o male - presents with L sided foot injury - puncture wound 24 hours ago - went into his foot about 1 incyh - wearing work boots - no drainage - no pain meds pre hospital, no n/v/f  On dorsum of foot has some redness -   Xray pending - looks unremarkable to me.  Lab work unneccessary - pt agreeable to oral antibiotics  Cipro to cover for pseudomonas Updated Tdap See picture - on APP chart  Medical screening examination/treatment/procedure(s) were conducted as a shared visit with non-physician practitioner(s) and myself.  I personally evaluated the patient during the encounter.  Clinical Impression:   Final diagnoses:  Puncture wound         Eber Hong, MD 01/17/20 [phone]

## 2020-01-16 NOTE — ED Triage Notes (Signed)
Patient arrived to ED via [facility] due to a left foot injury after stepping onto a nail one day ago.  Patient reports redness, swelling and pain to foot.  Patient does not recall when his last tetanus vaccine given.

## 2020-01-16 NOTE — ED Provider Notes (Signed)
[facility] EMERGENCY DEPARTMENT Provider Note   CSN: [id number] Arrival date & time: 01/16/20  1534     History Chief Complaint  Patient presents with  . Foot Injury    left    [name] is a 49 y.o. male with pertinent past medical history  for COPD, mental radiation, seizures, alcohol dependence, asthma, that presents to the emergency department for left foot puncture wound.  He states that last Thursday he was walking with work boots on and he stepped on a nail which punctured his boot and into his foot.  He states that about 1 inch of the nail was in his foot (upper 1/3 in the center).  He states that his left foot has been hurting and it has been the worst today, he has noticed redness and tightness around the dorsal aspect of his foot which started today.  He denies any drainage coming from the wound.  He has not taken anything for this.  He is not complaining of any nausea, vomiting, fevers, chills, back pain, rashes, abdominal pain, chest pain, shortness of breath, dizziness, headache.  He is unsure when his last tetanus was.  Patient is not a diabetic.  He does not take chronic steroids.  He does smoke cigarettes daily.  HPI     Past Medical History:  Diagnosis Date  . Alcohol dependence ([place])   . Asthma   . COPD (chronic obstructive pulmonary disease) ([place])   . Mental retardation   . Prostate enlargement   . Seizures ([place])     There are no problems to display for this patient.   Past Surgical History:  Procedure Laterality Date  . CYSTOSCOPY WITH BIOPSY  08/15/2011   Procedure: CYSTOSCOPY WITH BIOPSY;  Surgeon: Marissa Nestle;  Location: [facility];  Service: Urology;  Laterality: N/A;  Cystoscopy/Cystogram with Bladder Bx  . TRANSURETHRAL RESECTION OF PROSTATE         History reviewed. No pertinent family history.  Social History   Tobacco Use  . Smoking status: Current Every Day Smoker    Packs/day: 2.00    Years: 15.00    Pack years: 30.00  .  Smokeless tobacco: Current User    Types: Chew  Substance Use Topics  . Alcohol use: No  . Drug use: No    Home Medications Prior to Admission medications   Medication Sig Start Date End Date Taking? Authorizing Provider  acetaminophen (TYLENOL) 500 MG tablet Take 500 mg by mouth every 6 (six) hours as needed.    [provider]  albuterol (PROVENTIL HFA;VENTOLIN HFA) 108 (90 BASE) MCG/ACT inhaler Inhale 2 puffs into the lungs every 4 (four) hours as needed for wheezing or shortness of breath.     [provider]  ciprofloxacin (CIPRO) 500 MG tablet Take 1 tablet (500 mg total) by mouth every 12 (twelve) hours. 01/16/20   Alfredia Client, PA-C  Diclofenac-miSOPROStol 75-0.2 MG TBEC Take by mouth.    [provider]  hydrocortisone (ANUSOL-HC) 2.5 % rectal cream Apply rectally 2 times daily 08/24/18   Nuala Alpha A, PA-C    Allergies    Patient has no known allergies.  Review of Systems   Review of Systems  Constitutional: Negative for chills, fatigue and fever.  HENT: Negative for sinus pain and sore throat.   Eyes: Negative for pain.  Respiratory: Negative for chest tightness, shortness of breath and wheezing.   Cardiovascular: Negative for chest pain.  Gastrointestinal: Negative for nausea and vomiting.  Genitourinary: Negative for flank pain.  Musculoskeletal: Negative for arthralgias, back pain, gait problem, joint swelling and neck pain.  Skin: Positive for wound.  Neurological: Negative for dizziness and weakness.  Psychiatric/Behavioral: Negative for behavioral problems and confusion.    Physical Exam Updated Vital Signs BP 108/73 (BP Location: Right Arm)   Pulse 95   Temp 98.2 F (36.8 C) (Oral)   Resp 14   Ht 5\' 6"  (1.676 m)   Wt 66.7 kg   SpO2 97%   BMI 23.73 kg/m   Physical Exam .PE: Constitutional: well-developed, well-nourished, no apparent distress HENT: normocephalic, atraumatic Cardiovascular: normal rate and rhythm,  distal pulses intact Pulmonary/Chest: effort normal; breath sounds clear and equal bilaterally; no wheezes or rales Abdominal: soft and nontender Musculoskeletal: Left foot: Upper one third of the plantar foot with a central 2 mm puncture wound that is healing, no drainage or ulceration present.  No surrounding erythema or warmth.  Dorsal aspect of foot with erythema and warmth.  DP and PT pulses on left foot 2+.  Right foot without any abnormalities. See pictures below. Lymphadenopathy: no cervical adenopathy Neurological: alert with goal directed thinking Skin: warm and dry, no rash, no diaphoresis Psychiatric: normal mood and affect, normal behavior         ED Results / Procedures / Treatments   Labs (all labs ordered are listed, but only abnormal results are displayed) Labs Reviewed - No data to display  EKG None  Radiology DG Foot Complete Left  Result Date: 01/16/2020 CLINICAL DATA:  Left foot injury after stepping on nail 1 day prior, redness, swelling and pain to left foot. Suspected injury of at the metatarsophalangeal level. EXAM: LEFT FOOT - COMPLETE 3+ VIEW COMPARISON:  None. FINDINGS: There is minimal soft tissue swelling towards the forefoot. No soft tissue gas or foreign body is evident. No subjacent osseous erosions, periostitis or destructive changes to suggest radiographic features of osteomyelitis. No acute bony abnormality. Specifically, no fracture, subluxation, or dislocation. Midfoot and hindfoot alignment is grossly preserved though incompletely assessed on nonweightbearing films. Mild degenerative changes. Small posterior calcaneal spur is noted. IMPRESSION: 1. Minimal soft tissue swelling towards the forefoot. No soft tissue gas or foreign body. 2. No acute osseous abnormality. 3. Mild degenerative changes. Electronically Signed   By: 01/18/2020 M.D.   On: 01/16/2020 17:02    Procedures Procedures (including critical care time)  Medications Ordered in  ED Medications  Tdap (BOOSTRIX) injection 0.5 mL (has no administration in time range)    ED Course  I have reviewed the triage vital signs and the nursing notes.  Pertinent labs & imaging results that were available during my care of the patient were reviewed by me and considered in my medical decision making (see chart for details).    MDM Rules/Calculators/A&P                      [name] is a 49 y.o. male with pertinent past medical history  for COPD, mental radiation, seizures, alcohol dependence, asthma, that presents to the emergency department for left foot puncture wound.  Patient not complaining of any fevers, chills, nausea, vomiting.  Puncture wound is completely closed.  Foot x-ray without any acute abnormalities, fractures, gas, foreign objects.  Patient without a fever or tachycardia today.  Patient is stable and ready for discharge.  Will start Cipro.  Educated pt on side effects of medication. Strong ER return precautions given.  Tetanus given today.   51  I discussed this case with my attending physician, Dr. Hyacinth Meeker, who cosigned this note including patient's presenting symptoms, physical exam, and planned diagnostics and interventions. Attending physician stated agreement with plan or made changes to plan which were implemented.   Attending physician assessed patient at bedside.  Final Clinical Impression(s) / ED Diagnoses Final diagnoses:  Puncture wound    Rx / DC Orders ED Discharge Orders         Ordered    ciprofloxacin (CIPRO) 500 MG tablet  Every 12 hours     01/16/20 1702           Farrel Gordon, PA-C 01/16/20 1714    Eber Hong, MD 01/17/20 [phone]

## 2020-01-16 NOTE — Discharge Instructions (Addendum)
You were seen today for a left foot puncture wound.  Your x-rays did not show any abnormalities.  Take your antibiotic as directed.  You may have some side effects of nausea, vomiting, diarrhea.  Make sure you drink water and eat with it.  Follow-up with your PCP as soon as possible.  Come back to the emergency department if you have any reactions to the antibiotic, worsening pain, discharge from the wound, trouble with walking, fever, nausea, vomiting.  You were given tetanus vaccine today in the emergency department.

## 2020-03-09 DIAGNOSIS — I10 Essential (primary) hypertension: Secondary | ICD-10-CM | POA: Diagnosis not present

## 2020-03-09 DIAGNOSIS — R7303 Prediabetes: Secondary | ICD-10-CM | POA: Diagnosis not present

## 2020-03-09 DIAGNOSIS — E785 Hyperlipidemia, unspecified: Secondary | ICD-10-CM | POA: Diagnosis not present

## 2020-03-23 DIAGNOSIS — F17210 Nicotine dependence, cigarettes, uncomplicated: Secondary | ICD-10-CM | POA: Diagnosis not present

## 2020-03-23 DIAGNOSIS — Z79899 Other long term (current) drug therapy: Secondary | ICD-10-CM | POA: Diagnosis not present

## 2020-03-23 DIAGNOSIS — J449 Chronic obstructive pulmonary disease, unspecified: Secondary | ICD-10-CM | POA: Diagnosis not present

## 2020-03-23 DIAGNOSIS — R7303 Prediabetes: Secondary | ICD-10-CM | POA: Diagnosis not present

## 2020-03-23 DIAGNOSIS — E782 Mixed hyperlipidemia: Secondary | ICD-10-CM | POA: Diagnosis not present

## 2020-03-23 DIAGNOSIS — F209 Schizophrenia, unspecified: Secondary | ICD-10-CM | POA: Diagnosis not present

## 2020-03-23 DIAGNOSIS — I10 Essential (primary) hypertension: Secondary | ICD-10-CM | POA: Diagnosis not present

## 2020-05-04 DIAGNOSIS — F1021 Alcohol dependence, in remission: Secondary | ICD-10-CM | POA: Diagnosis not present

## 2020-06-15 DIAGNOSIS — E782 Mixed hyperlipidemia: Secondary | ICD-10-CM | POA: Diagnosis not present

## 2020-06-15 DIAGNOSIS — I10 Essential (primary) hypertension: Secondary | ICD-10-CM | POA: Diagnosis not present

## 2020-06-15 DIAGNOSIS — R7303 Prediabetes: Secondary | ICD-10-CM | POA: Diagnosis not present

## 2020-06-15 DIAGNOSIS — Z79899 Other long term (current) drug therapy: Secondary | ICD-10-CM | POA: Diagnosis not present

## 2020-06-23 DIAGNOSIS — E782 Mixed hyperlipidemia: Secondary | ICD-10-CM | POA: Diagnosis not present

## 2020-06-23 DIAGNOSIS — F17210 Nicotine dependence, cigarettes, uncomplicated: Secondary | ICD-10-CM | POA: Diagnosis not present

## 2020-06-23 DIAGNOSIS — Z79899 Other long term (current) drug therapy: Secondary | ICD-10-CM | POA: Diagnosis not present

## 2020-06-23 DIAGNOSIS — J449 Chronic obstructive pulmonary disease, unspecified: Secondary | ICD-10-CM | POA: Diagnosis not present

## 2020-06-23 DIAGNOSIS — R7303 Prediabetes: Secondary | ICD-10-CM | POA: Diagnosis not present

## 2020-07-19 DIAGNOSIS — F1021 Alcohol dependence, in remission: Secondary | ICD-10-CM | POA: Diagnosis not present

## 2020-07-19 DIAGNOSIS — F319 Bipolar disorder, unspecified: Secondary | ICD-10-CM | POA: Diagnosis not present

## 2020-09-07 DIAGNOSIS — J449 Chronic obstructive pulmonary disease, unspecified: Secondary | ICD-10-CM | POA: Diagnosis not present

## 2020-09-07 DIAGNOSIS — Z131 Encounter for screening for diabetes mellitus: Secondary | ICD-10-CM | POA: Diagnosis not present

## 2020-09-07 DIAGNOSIS — Z1329 Encounter for screening for other suspected endocrine disorder: Secondary | ICD-10-CM | POA: Diagnosis not present

## 2020-09-07 DIAGNOSIS — F17210 Nicotine dependence, cigarettes, uncomplicated: Secondary | ICD-10-CM | POA: Diagnosis not present

## 2020-09-07 DIAGNOSIS — Z0001 Encounter for general adult medical examination with abnormal findings: Secondary | ICD-10-CM | POA: Diagnosis not present

## 2020-09-07 DIAGNOSIS — E559 Vitamin D deficiency, unspecified: Secondary | ICD-10-CM | POA: Diagnosis not present

## 2020-09-07 DIAGNOSIS — Z130 Encounter for screening for diseases of the blood and blood-forming organs and certain disorders involving the immune mechanism: Secondary | ICD-10-CM | POA: Diagnosis not present

## 2020-09-07 DIAGNOSIS — Z79899 Other long term (current) drug therapy: Secondary | ICD-10-CM | POA: Diagnosis not present

## 2020-09-07 DIAGNOSIS — E785 Hyperlipidemia, unspecified: Secondary | ICD-10-CM | POA: Diagnosis not present

## 2020-09-21 DIAGNOSIS — Z1329 Encounter for screening for other suspected endocrine disorder: Secondary | ICD-10-CM | POA: Diagnosis not present

## 2020-09-21 DIAGNOSIS — E785 Hyperlipidemia, unspecified: Secondary | ICD-10-CM | POA: Diagnosis not present

## 2020-09-21 DIAGNOSIS — E559 Vitamin D deficiency, unspecified: Secondary | ICD-10-CM | POA: Diagnosis not present

## 2020-09-21 DIAGNOSIS — I10 Essential (primary) hypertension: Secondary | ICD-10-CM | POA: Diagnosis not present

## 2020-09-21 DIAGNOSIS — Z131 Encounter for screening for diabetes mellitus: Secondary | ICD-10-CM | POA: Diagnosis not present

## 2020-09-21 DIAGNOSIS — Z130 Encounter for screening for diseases of the blood and blood-forming organs and certain disorders involving the immune mechanism: Secondary | ICD-10-CM | POA: Diagnosis not present

## 2020-09-21 DIAGNOSIS — R7303 Prediabetes: Secondary | ICD-10-CM | POA: Diagnosis not present

## 2020-09-21 DIAGNOSIS — Z79899 Other long term (current) drug therapy: Secondary | ICD-10-CM | POA: Diagnosis not present

## 2020-09-30 DIAGNOSIS — R7303 Prediabetes: Secondary | ICD-10-CM | POA: Diagnosis not present

## 2020-09-30 DIAGNOSIS — E782 Mixed hyperlipidemia: Secondary | ICD-10-CM | POA: Diagnosis not present

## 2020-09-30 DIAGNOSIS — F17210 Nicotine dependence, cigarettes, uncomplicated: Secondary | ICD-10-CM | POA: Diagnosis not present

## 2020-09-30 DIAGNOSIS — J449 Chronic obstructive pulmonary disease, unspecified: Secondary | ICD-10-CM | POA: Diagnosis not present

## 2020-12-17 DIAGNOSIS — E782 Mixed hyperlipidemia: Secondary | ICD-10-CM | POA: Diagnosis not present

## 2020-12-17 DIAGNOSIS — I10 Essential (primary) hypertension: Secondary | ICD-10-CM | POA: Diagnosis not present

## 2020-12-17 DIAGNOSIS — R7303 Prediabetes: Secondary | ICD-10-CM | POA: Diagnosis not present

## 2020-12-28 DIAGNOSIS — F319 Bipolar disorder, unspecified: Secondary | ICD-10-CM | POA: Diagnosis not present

## 2020-12-29 DIAGNOSIS — R7303 Prediabetes: Secondary | ICD-10-CM | POA: Diagnosis not present

## 2020-12-29 DIAGNOSIS — E782 Mixed hyperlipidemia: Secondary | ICD-10-CM | POA: Diagnosis not present

## 2022-03-11 IMAGING — DX DG FOOT COMPLETE 3+V*L*
3 series · 3 of 3 positions shown · non-contrast
Comparison: None.

CLINICAL DATA: Left foot injury after stepping on nail 1 day prior,
redness, swelling and pain to left foot. Suspected injury of at the
metatarsophalangeal level.

EXAM:
LEFT FOOT - COMPLETE 3+ VIEW

[foot ap]
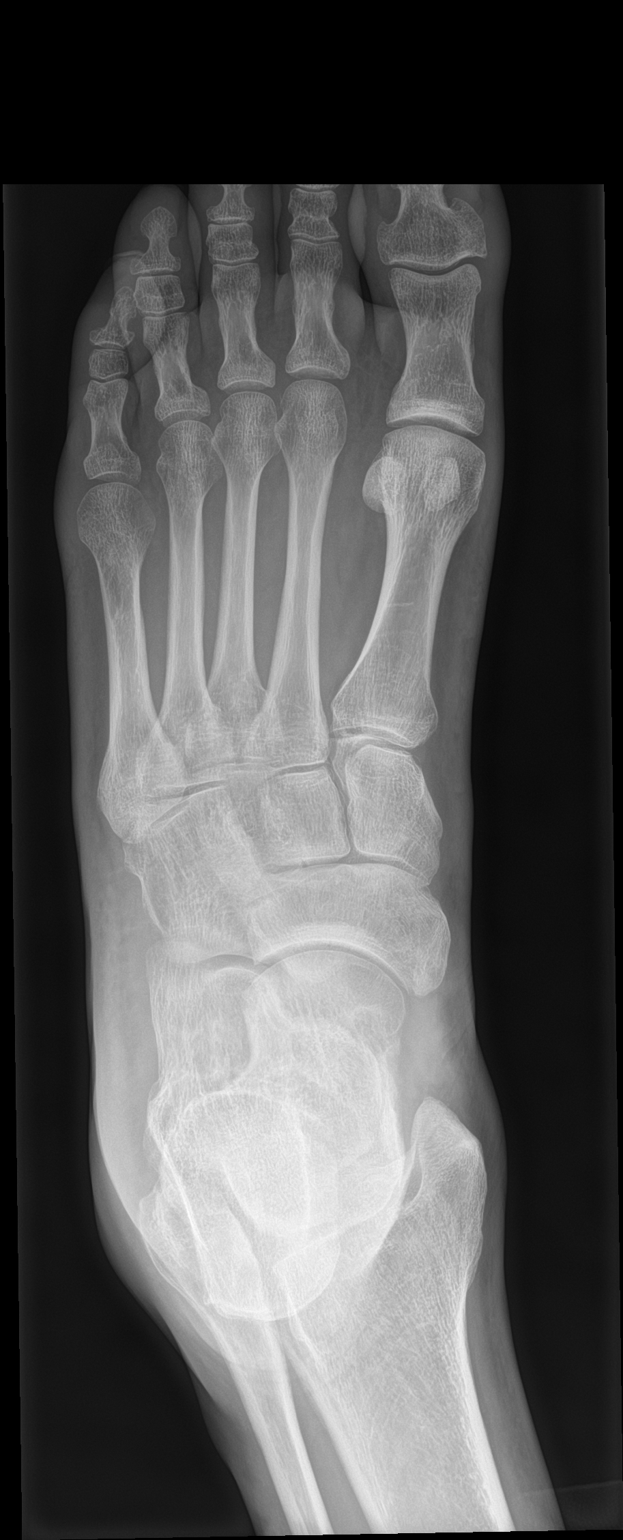

[foot obl]
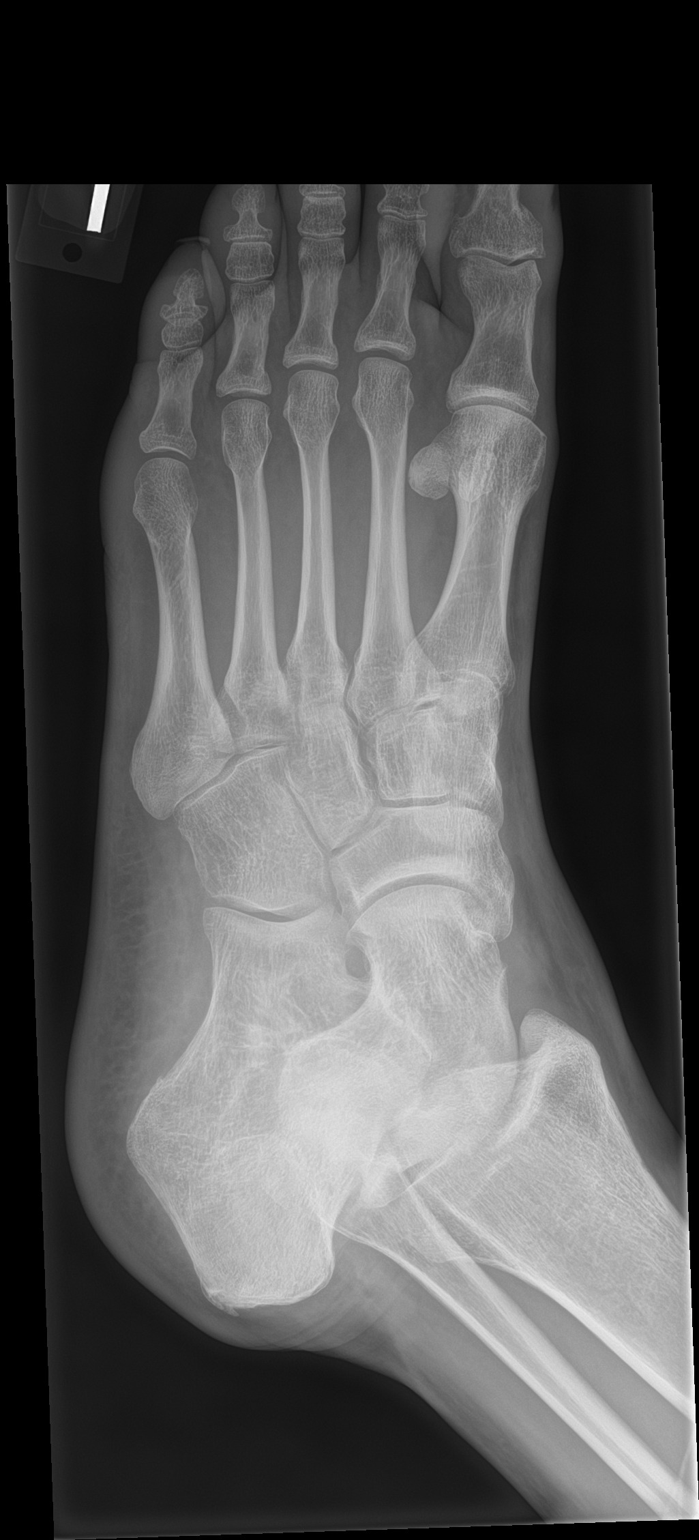

[foot lat]
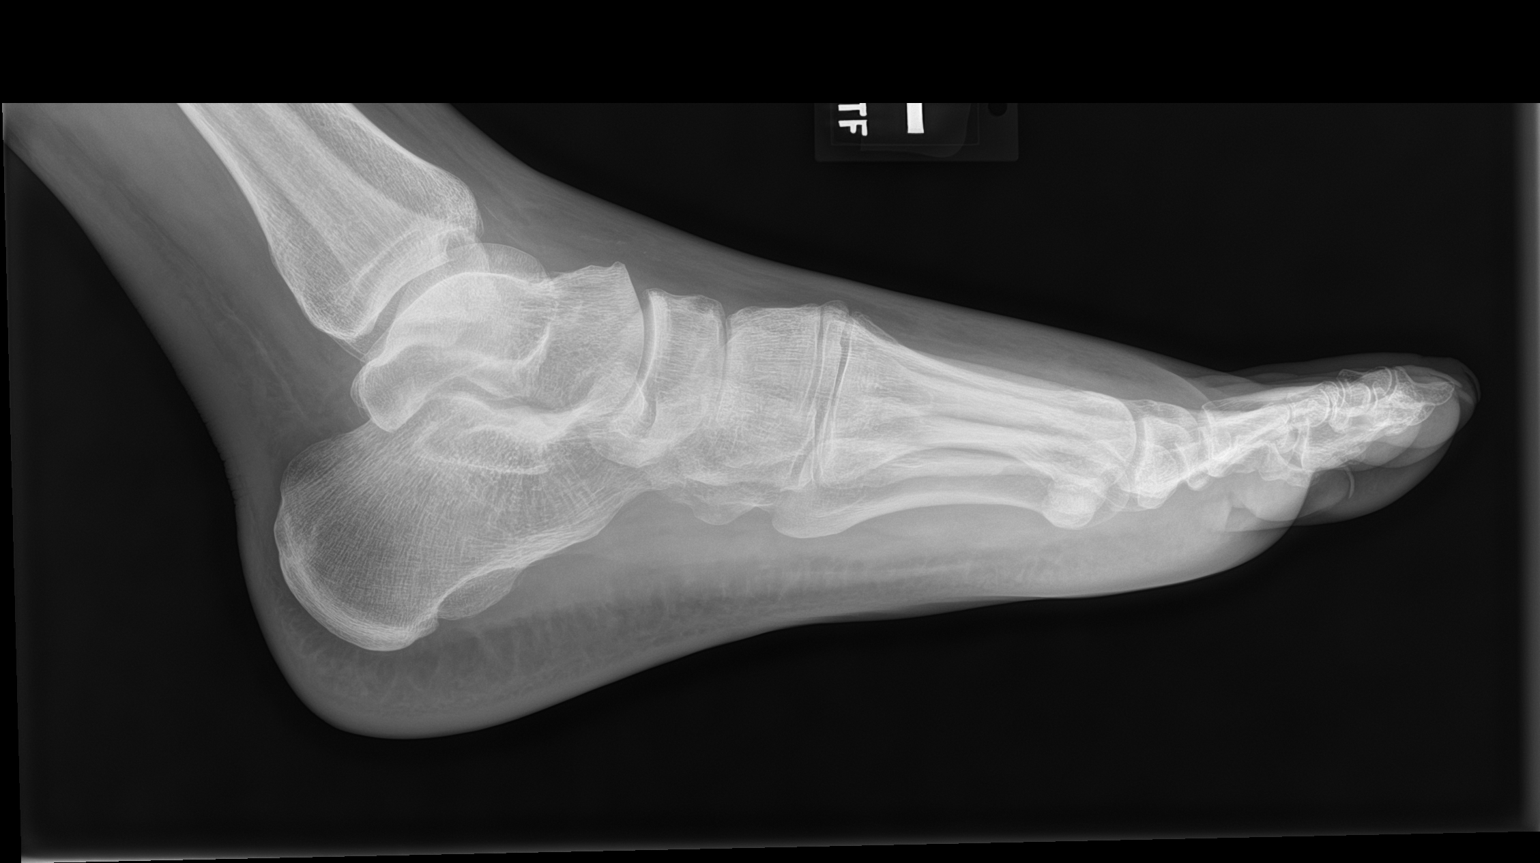

[3 of 3 positions shown; findings below may reference images not displayed]

FINDINGS: There is minimal soft tissue swelling towards the forefoot. No soft
tissue gas or foreign body is evident. No subjacent osseous
erosions, periostitis or destructive changes to suggest radiographic
features of osteomyelitis. No acute bony abnormality. Specifically,
no fracture, subluxation, or dislocation. Midfoot and hindfoot
alignment is grossly preserved though incompletely assessed on
nonweightbearing films. Mild degenerative changes. Small posterior
calcaneal spur is noted.
IMPRESSION: 1. Minimal soft tissue swelling towards the forefoot. No soft tissue
gas or foreign body.
2. No acute osseous abnormality.
3. Mild degenerative changes.

## 2022-08-31 ENCOUNTER — Encounter: Payer: Self-pay | Admitting: *Deleted

## 2023-02-26 ENCOUNTER — Encounter: Payer: Self-pay | Admitting: *Deleted

## 2023-10-19 ENCOUNTER — Encounter (INDEPENDENT_AMBULATORY_CARE_PROVIDER_SITE_OTHER): Payer: Self-pay | Admitting: *Deleted

## 2024-04-17 ENCOUNTER — Encounter (INDEPENDENT_AMBULATORY_CARE_PROVIDER_SITE_OTHER): Payer: Self-pay | Admitting: *Deleted
